# Patient Record
Sex: Male | Born: 1975
Health system: Southern US, Community
[De-identification: ages and names within clinical notes are randomized; demographics above are authoritative.]

## PROBLEM LIST (undated history)

## (undated) HISTORY — PX: OTHER SURGICAL HISTORY: SHX169

---
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
## 2019-05-30 DIAGNOSIS — M1711 Unilateral primary osteoarthritis, right knee: Secondary | ICD-10-CM | POA: Diagnosis not present

## 2019-05-30 DIAGNOSIS — M25561 Pain in right knee: Secondary | ICD-10-CM | POA: Diagnosis not present

## 2019-05-30 DIAGNOSIS — M25562 Pain in left knee: Secondary | ICD-10-CM | POA: Diagnosis not present

## 2019-06-15 DIAGNOSIS — M25562 Pain in left knee: Secondary | ICD-10-CM | POA: Diagnosis not present

## 2019-06-21 DIAGNOSIS — M25562 Pain in left knee: Secondary | ICD-10-CM | POA: Diagnosis not present

## 2019-06-21 DIAGNOSIS — M222X2 Patellofemoral disorders, left knee: Secondary | ICD-10-CM | POA: Diagnosis not present

## 2019-07-06 DIAGNOSIS — M25561 Pain in right knee: Secondary | ICD-10-CM | POA: Diagnosis not present

## 2019-07-06 DIAGNOSIS — M222X1 Patellofemoral disorders, right knee: Secondary | ICD-10-CM | POA: Diagnosis not present

## 2019-07-06 DIAGNOSIS — M222X2 Patellofemoral disorders, left knee: Secondary | ICD-10-CM | POA: Diagnosis not present

## 2019-07-06 DIAGNOSIS — M25562 Pain in left knee: Secondary | ICD-10-CM | POA: Diagnosis not present

## 2019-07-27 DIAGNOSIS — M25561 Pain in right knee: Secondary | ICD-10-CM | POA: Diagnosis not present

## 2019-07-27 DIAGNOSIS — M222X1 Patellofemoral disorders, right knee: Secondary | ICD-10-CM | POA: Diagnosis not present

## 2019-07-27 DIAGNOSIS — M222X2 Patellofemoral disorders, left knee: Secondary | ICD-10-CM | POA: Diagnosis not present

## 2019-07-27 DIAGNOSIS — M25562 Pain in left knee: Secondary | ICD-10-CM | POA: Diagnosis not present

## 2019-10-13 DIAGNOSIS — Z20828 Contact with and (suspected) exposure to other viral communicable diseases: Secondary | ICD-10-CM | POA: Diagnosis not present

## 2019-10-13 DIAGNOSIS — Z03818 Encounter for observation for suspected exposure to other biological agents ruled out: Secondary | ICD-10-CM | POA: Diagnosis not present

## 2020-02-27 ENCOUNTER — Other Ambulatory Visit: Payer: Self-pay

## 2020-02-27 ENCOUNTER — Ambulatory Visit (INDEPENDENT_AMBULATORY_CARE_PROVIDER_SITE_OTHER): Payer: BC Managed Care – PPO | Admitting: Family Medicine

## 2020-02-27 ENCOUNTER — Encounter: Payer: Self-pay | Admitting: Family Medicine

## 2020-02-27 VITALS — BP 118/76 | HR 87 | Temp 97.8°F | Ht 72.75 in | Wt 182.1 lb

## 2020-02-27 DIAGNOSIS — Z0000 Encounter for general adult medical examination without abnormal findings: Secondary | ICD-10-CM

## 2020-02-27 DIAGNOSIS — Z23 Encounter for immunization: Secondary | ICD-10-CM

## 2020-02-27 DIAGNOSIS — D229 Melanocytic nevi, unspecified: Secondary | ICD-10-CM | POA: Diagnosis not present

## 2020-02-27 NOTE — Progress Notes (Signed)
New Patient Office Visit  Subjective:  Patient ID: John Guerra, male    DOB: 09-23-1975  Age: 44 y.o. MRN: [id number]  CC:  Chief Complaint  Patient presents with  . New Patient (Initial Visit)    pt is here to establish care     HPI John Guerra presents for establishing care and for well visit.  Generally very healthy.  He grew up in the [facility] area.  He had previous left inguinal hernia repair around age 87.  Previous arthroscopic knee surgery around age 5.  Very engaged with sports.  He is done several triathlons including couple of [place].  He is starting to develop some increasing knee pains over time  Social history-married with 3 children.  He lived in [place] for about 8 years.  Works for [employer].  Never smoked.  Occasional alcohol use.  Family history-mother and father both alive.  Mom has type 2 diabetes.  Father he thinks had stroke.  He is not sure but thinks his father's had congestive heart failure.  He had a sister that died of drug overdose.  His last tetanus is unknown.  He thinks this has been probably around 12 years ago.  No past medical history on file.    No family history on file.  Social History   Socioeconomic History  . Marital status: Married    Spouse name: Not on file  . Number of children: Not on file  . Years of education: Not on file  . Highest education level: Not on file  Occupational History  . Not on file  Tobacco Use  . Smoking status: Never Smoker  . Smokeless tobacco: Never Used  Substance and Sexual Activity  . Alcohol use: Not on file  . Drug use: Not on file  . Sexual activity: Yes    Partners: Female  Other Topics Concern  . Not on file  Social History Narrative  . Not on file   Social Determinants of Health   Financial Resource Strain:   . Difficulty of Paying Living Expenses:   Food Insecurity:   . Worried About Charity fundraiser in the Last Year:   . Arboriculturist in the Last  Year:   Transportation Needs:   . Film/video editor (Medical):   Marland Kitchen Lack of Transportation (Non-Medical):   Physical Activity:   . Days of Exercise per Week:   . Minutes of Exercise per Session:   Stress:   . Feeling of Stress :   Social Connections:   . Frequency of Communication with Friends and Family:   . Frequency of Social Gatherings with Friends and Family:   . Attends Religious Services:   . Active Member of Clubs or Organizations:   . Attends Archivist Meetings:   Marland Kitchen Marital Status:   Intimate Partner Violence:   . Fear of Current or Ex-Partner:   . Emotionally Abused:   Marland Kitchen Physically Abused:   . Sexually Abused:     ROS Review of Systems  Constitutional: Negative for activity change, appetite change, fatigue and fever.  HENT: Negative for congestion, ear pain and trouble swallowing.   Eyes: Negative for pain and visual disturbance.  Respiratory: Negative for cough, shortness of breath and wheezing.   Cardiovascular: Negative for chest pain and palpitations.  Gastrointestinal: Negative for abdominal distention, abdominal pain, blood in stool, constipation, diarrhea, nausea, rectal pain and vomiting.  Endocrine: Negative for polydipsia and polyuria.  Genitourinary: Negative  for dysuria, hematuria and testicular pain.  Musculoskeletal: Negative for arthralgias and joint swelling.  Skin: Negative for rash.  Neurological: Negative for dizziness, syncope and headaches.  Hematological: Negative for adenopathy.  Psychiatric/Behavioral: Negative for confusion and dysphoric mood.    Objective:   Today's Vitals: BP 118/76 (BP Location: Left Arm, Patient Position: Sitting, Cuff Size: Normal)   Pulse 87   Temp 97.8 F (36.6 C) (Temporal)   Ht 6' 0.75" (1.848 m)   Wt 182 lb 1.6 oz (82.6 kg)   SpO2 99%   BMI 24.19 kg/m   Physical Exam Vitals reviewed.  Constitutional:      Appearance: Normal appearance.  HENT:     Right Ear: Tympanic membrane and ear  canal normal.     Left Ear: Ear canal normal.  Cardiovascular:     Rate and Rhythm: Normal rate and regular rhythm.     Heart sounds: No murmur heard.   Pulmonary:     Effort: Pulmonary effort is normal.     Breath sounds: Normal breath sounds.  Abdominal:     Palpations: Abdomen is soft. There is no mass.     Tenderness: There is no abdominal tenderness.  Musculoskeletal:     Cervical back: Neck supple.     Right lower leg: No edema.     Left lower leg: No edema.  Lymphadenopathy:     Cervical: No cervical adenopathy.  Skin:    Comments: He has multiple nevi scattered on his trunk.  Neurological:     Mental Status: He is alert.     Assessment & Plan:   Problem List Items Addressed This Visit    None    Visit Diagnoses    Physical exam    -  Primary   Relevant Orders   Basic metabolic panel   Lipid panel   CBC with Differential/Platelet   TSH   Hepatic function panel   Need for Tdap vaccination       Relevant Orders   Tdap vaccine greater than or equal to 7yo IM (Completed)    Generally very healthy 44 year old male.  Mostly vegetarian diet.  Stays very engaged in physical activity.  Does have multiple nevi and we will set up dermatology assessment. Screening labs obtained.  Would consider B12 500 to 1000 mcg supplement daily with his mostly vegetarian diet  Tdap booster given  No outpatient encounter medications on file as of 02/27/2020.   No facility-administered encounter medications on file as of 02/27/2020.    Follow-up: No follow-ups on file.   Carolann Littler, MD

## 2020-02-28 LAB — HEPATIC FUNCTION PANEL
AG Ratio: 2 (calc) (ref 1.0–2.5)
ALT: 9 U/L (ref 9–46)
AST: 16 U/L (ref 10–40)
Albumin: 5 g/dL (ref 3.6–5.1)
Alkaline phosphatase (APISO): 46 U/L (ref 36–130)
Bilirubin, Direct: 0.3 mg/dL — ABNORMAL HIGH (ref 0.0–0.2)
Globulin: 2.5 g/dL (calc) (ref 1.9–3.7)
Indirect Bilirubin: 1.2 mg/dL (calc) (ref 0.2–1.2)
Total Bilirubin: 1.5 mg/dL — ABNORMAL HIGH (ref 0.2–1.2)
Total Protein: 7.5 g/dL (ref 6.1–8.1)

## 2020-02-28 LAB — BASIC METABOLIC PANEL
BUN: 14 mg/dL (ref 7–25)
CO2: 30 mmol/L (ref 20–32)
Calcium: 9.9 mg/dL (ref 8.6–10.3)
Chloride: 102 mmol/L (ref 98–110)
Creat: 0.99 mg/dL (ref 0.60–1.35)
Glucose, Bld: 93 mg/dL (ref 65–99)
Potassium: 4.3 mmol/L (ref 3.5–5.3)
Sodium: 138 mmol/L (ref 135–146)

## 2020-02-28 LAB — CBC WITH DIFFERENTIAL/PLATELET
Absolute Monocytes: 509 cells/uL (ref 200–950)
Basophils Absolute: 58 cells/uL (ref 0–200)
Basophils Relative: 1.1 %
Eosinophils Absolute: 69 cells/uL (ref 15–500)
Eosinophils Relative: 1.3 %
HCT: 39.2 % (ref 38.5–50.0)
Hemoglobin: 13.1 g/dL — ABNORMAL LOW (ref 13.2–17.1)
Lymphs Abs: 1590 cells/uL (ref 850–3900)
MCH: 31.3 pg (ref 27.0–33.0)
MCHC: 33.4 g/dL (ref 32.0–36.0)
MCV: 93.8 fL (ref 80.0–100.0)
MPV: 9.8 fL (ref 7.5–12.5)
Monocytes Relative: 9.6 %
Neutro Abs: 3074 cells/uL (ref 1500–7800)
Neutrophils Relative %: 58 %
Platelets: 227 10*3/uL (ref 140–400)
RBC: 4.18 10*6/uL — ABNORMAL LOW (ref 4.20–5.80)
RDW: 12.3 % (ref 11.0–15.0)
Total Lymphocyte: 30 %
WBC: 5.3 10*3/uL (ref 3.8–10.8)

## 2020-02-28 LAB — LIPID PANEL
Cholesterol: 186 mg/dL (ref <200)
HDL: 72 mg/dL (ref >=40)
LDL Cholesterol (Calc): 97 mg/dL (calc)
Non-HDL Cholesterol (Calc): 114 mg/dL (calc) (ref <130)
Total CHOL/HDL Ratio: 2.6 (calc) (ref <5.0)
Triglycerides: 84 mg/dL (ref <150)

## 2020-02-28 LAB — TSH: TSH: 0.93 mIU/L (ref 0.40–4.50)

## 2020-02-29 ENCOUNTER — Telehealth: Payer: Self-pay | Admitting: Family Medicine

## 2020-02-29 NOTE — Telephone Encounter (Signed)
Pt returning yesterday's call.

## 2020-03-01 ENCOUNTER — Other Ambulatory Visit: Payer: Self-pay

## 2020-03-01 DIAGNOSIS — D649 Anemia, unspecified: Secondary | ICD-10-CM

## 2020-03-01 NOTE — Telephone Encounter (Signed)
See lab results.  

## 2020-03-04 NOTE — Addendum Note (Signed)
Addended by: Marrion Coy on: 03/04/2020 03:51 PM   Modules accepted: Orders

## 2020-03-25 ENCOUNTER — Other Ambulatory Visit: Payer: Self-pay

## 2020-03-25 ENCOUNTER — Other Ambulatory Visit (INDEPENDENT_AMBULATORY_CARE_PROVIDER_SITE_OTHER): Payer: BC Managed Care – PPO

## 2020-03-25 DIAGNOSIS — D649 Anemia, unspecified: Secondary | ICD-10-CM

## 2020-03-25 DIAGNOSIS — D510 Vitamin B12 deficiency anemia due to intrinsic factor deficiency: Secondary | ICD-10-CM | POA: Diagnosis not present

## 2020-03-26 ENCOUNTER — Other Ambulatory Visit: Payer: Self-pay

## 2020-03-26 DIAGNOSIS — D649 Anemia, unspecified: Secondary | ICD-10-CM

## 2020-03-26 LAB — CBC WITH DIFFERENTIAL/PLATELET
Absolute Monocytes: 499 cells/uL (ref 200–950)
Basophils Absolute: 58 cells/uL (ref 0–200)
Basophils Relative: 1.2 %
Eosinophils Absolute: 149 cells/uL (ref 15–500)
Eosinophils Relative: 3.1 %
HCT: 36.4 % — ABNORMAL LOW (ref 38.5–50.0)
Hemoglobin: 12.2 g/dL — ABNORMAL LOW (ref 13.2–17.1)
Lymphs Abs: 1584 cells/uL (ref 850–3900)
MCH: 31 pg (ref 27.0–33.0)
MCHC: 33.5 g/dL (ref 32.0–36.0)
MCV: 92.6 fL (ref 80.0–100.0)
MPV: 10 fL (ref 7.5–12.5)
Monocytes Relative: 10.4 %
Neutro Abs: 2510 cells/uL (ref 1500–7800)
Neutrophils Relative %: 52.3 %
Platelets: 198 10*3/uL (ref 140–400)
RBC: 3.93 10*6/uL — ABNORMAL LOW (ref 4.20–5.80)
RDW: 12.5 % (ref 11.0–15.0)
Total Lymphocyte: 33 %
WBC: 4.8 10*3/uL (ref 3.8–10.8)

## 2020-03-26 LAB — IRON,TIBC AND FERRITIN PANEL
%SAT: 34 % (calc) (ref 20–48)
Ferritin: 36 ng/mL — ABNORMAL LOW (ref 38–380)
Iron: 122 ug/dL (ref 50–180)
TIBC: 356 mcg/dL (calc) (ref 250–425)

## 2020-03-26 LAB — VITAMIN B12: Vitamin B-12: 283 pg/mL (ref 200–1100)

## 2020-04-05 ENCOUNTER — Other Ambulatory Visit: Payer: BC Managed Care – PPO

## 2020-04-05 DIAGNOSIS — D649 Anemia, unspecified: Secondary | ICD-10-CM

## 2020-04-05 LAB — FECAL OCCULT BLOOD, IMMUNOCHEMICAL: Fecal Occult Bld: NEGATIVE

## 2020-04-05 NOTE — Addendum Note (Signed)
Addended by: Trenda Moots on: 5/52/5894 10:15 AM   Modules accepted: Orders

## 2020-04-06 DIAGNOSIS — Z03818 Encounter for observation for suspected exposure to other biological agents ruled out: Secondary | ICD-10-CM | POA: Diagnosis not present

## 2020-04-06 DIAGNOSIS — Z20822 Contact with and (suspected) exposure to covid-19: Secondary | ICD-10-CM | POA: Diagnosis not present

## 2020-05-10 DIAGNOSIS — L989 Disorder of the skin and subcutaneous tissue, unspecified: Secondary | ICD-10-CM

## 2020-05-10 DIAGNOSIS — D1801 Hemangioma of skin and subcutaneous tissue: Secondary | ICD-10-CM | POA: Diagnosis not present

## 2020-05-10 DIAGNOSIS — D225 Melanocytic nevi of trunk: Secondary | ICD-10-CM | POA: Diagnosis not present

## 2020-05-10 DIAGNOSIS — L821 Other seborrheic keratosis: Secondary | ICD-10-CM | POA: Diagnosis not present

## 2020-05-10 HISTORY — DX: Disorder of the skin and subcutaneous tissue, unspecified: L98.9

## 2020-11-19 ENCOUNTER — Ambulatory Visit (INDEPENDENT_AMBULATORY_CARE_PROVIDER_SITE_OTHER): Payer: BC Managed Care – PPO | Admitting: Family Medicine

## 2020-11-19 ENCOUNTER — Encounter: Payer: Self-pay | Admitting: Family Medicine

## 2020-11-19 ENCOUNTER — Other Ambulatory Visit: Payer: Self-pay

## 2020-11-19 VITALS — BP 130/80 | HR 63 | Temp 98.0°F | Wt 183.5 lb

## 2020-11-19 DIAGNOSIS — L02214 Cutaneous abscess of groin: Secondary | ICD-10-CM | POA: Diagnosis not present

## 2020-11-19 NOTE — Progress Notes (Signed)
Established Patient Office Visit  Subjective:  Patient ID: John Guerra, male    DOB: 1976-07-29  Age: 45 y.o. MRN: [id number]  CC:  Chief Complaint  Patient presents with  . saddle sore    X 2 weeks, continues to get bigger and more painful    HPI [name] presents for "saddle sore "which started about 2 weeks ago.  He did about a 2-hour bike ride and noticed some soreness after that.  He had some swelling and mild erythema and pain has gotten worse gradually over 2 weeks.  No drainage.  Pain with sitting since then.  No fevers or chills.  No history of abscess in this region.  Generally very healthy and takes no regular medications.  He is currently training for [employer] triathlon but has had to curtail training past couple weeks because of this.  Past Medical History:  Diagnosis Date  . Benign skin lesion 05/10/2020      No family history on file.  Social History   Socioeconomic History  . Marital status: Married    Spouse name: Not on file  . Number of children: Not on file  . Years of education: Not on file  . Highest education level: Not on file  Occupational History  . Not on file  Tobacco Use  . Smoking status: Never Smoker  . Smokeless tobacco: Never Used  Substance and Sexual Activity  . Alcohol use: Not on file  . Drug use: Not on file  . Sexual activity: Yes    Partners: Female  Other Topics Concern  . Not on file  Social History Narrative  . Not on file   Social Determinants of Health   Financial Resource Strain: Not on file  Food Insecurity: Not on file  Transportation Needs: Not on file  Physical Activity: Not on file  Stress: Not on file  Social Connections: Not on file  Intimate Partner Violence: Not on file    No outpatient medications prior to visit.   No facility-administered medications prior to visit.    No Known Allergies  ROS Review of Systems  Constitutional: Negative for chills and fever.       Objective:    Physical Exam Vitals reviewed.  Constitutional:      Appearance: Normal appearance.  Cardiovascular:     Rate and Rhythm: Normal rate and regular rhythm.  Skin:    Comments: Left groin region reveals somewhat elongated area of mild erythema and swelling.  This is inferior to the rectal region and lateral to the scrotal region.  Very tender to palpation and fluctuant  Neurological:     Mental Status: He is alert.     BP 130/80 (BP Location: Left Arm, Patient Position: Sitting, Cuff Size: Normal)   Pulse 63   Temp 98 F (36.7 C) (Oral)   Wt 183 lb 8 oz (83.2 kg)   SpO2 98%   BMI 24.38 kg/m  Wt Readings from Last 3 Encounters:  11/19/20 183 lb 8 oz (83.2 kg)  02/27/20 182 lb 1.6 oz (82.6 kg)     Health Maintenance Due  Topic Date Due  . Hepatitis C Screening  Never done  . HIV Screening  Never done    There are no preventive care reminders to display for this patient.  Lab Results  Component Value Date   TSH 0.93 02/27/2020   Lab Results  Component Value Date   WBC 4.8 03/25/2020   HGB 12.2 (L) 03/25/2020  HCT 36.4 (L) 03/25/2020   MCV 92.6 03/25/2020   PLT 198 03/25/2020   Lab Results  Component Value Date   NA 138 02/27/2020   K 4.3 02/27/2020   CO2 30 02/27/2020   GLUCOSE 93 02/27/2020   BUN 14 02/27/2020   CREATININE 0.99 02/27/2020   BILITOT 1.5 (H) 02/27/2020   AST 16 02/27/2020   ALT 9 02/27/2020   PROT 7.5 02/27/2020   CALCIUM 9.9 02/27/2020   Lab Results  Component Value Date   CHOL 186 02/27/2020   Lab Results  Component Value Date   HDL 72 02/27/2020   Lab Results  Component Value Date   LDLCALC 97 02/27/2020   Lab Results  Component Value Date   TRIG 84 02/27/2020   Lab Results  Component Value Date   CHOLHDL 2.6 02/27/2020   No results found for: HGBA1C    Assessment & Plan:   Left groin abscess.  He has had issues for 2 weeks now.  Area of fluctuance as addressed above.  We discussed the risk and  benefits of incision and drainage including risk of pain and bleeding and patient consented.  Prepped skin with Betadine.  Local anesthesia with 1% plain Xylocaine.  Made approximately 1 cm linear incision over area of fluctuance with #11 blade.  Copious pus was expressed.  Explored deeper pus pockets with curved hemostats.  Minimal bleeding.  Wound cavity was packed with quarter inch iodoform gauze.  Outer dressing applied.  -Keep area dry until follow-up -He will follow-up late tomorrow afternoon for reassessment -After packing removed we will plan warm sitz baths for the next few days  No orders of the defined types were placed in this encounter.   Follow-up: No follow-ups on file.    Carolann Littler, MD

## 2020-11-19 NOTE — Patient Instructions (Signed)
Skin Abscess  A skin abscess is an infected area on or under your skin that contains a collection of pus and other material. An abscess may also be called a furuncle, carbuncle, or boil. An abscess can occur in or on almost any part of your body. Some abscesses break open (rupture) on their own. Most continue to get worse unless they are treated. The infection can spread deeper into the body and eventually into your blood, which can make you feel ill. Treatment usually involves draining the abscess. What are the causes? An abscess occurs when germs, like bacteria, pass through your skin and cause an infection. This may be caused by:  A scrape or cut on your skin.  A puncture wound through your skin, including a needle injection or insect bite.  Blocked oil or sweat glands.  Blocked and infected hair follicles.  A cyst that forms beneath your skin (sebaceous cyst) and becomes infected. What increases the risk? This condition is more likely to develop in people who:  Have a weak body defense system (immune system).  Have diabetes.  Have dry and irritated skin.  Get frequent injections or use illegal IV drugs.  Have a foreign body in a wound, such as a splinter.  Have problems with their lymph system or veins. What are the signs or symptoms? Symptoms of this condition include:  A painful, firm bump under the skin.  A bump with pus at the top. This may break through the skin and drain. Other symptoms include:  Redness surrounding the abscess site.  Warmth.  Swelling of the lymph nodes (glands) near the abscess.  Tenderness.  A sore on the skin. How is this diagnosed? This condition may be diagnosed based on:  A physical exam.  Your medical history.  A sample of pus. This may be used to find out what is causing the infection.  Blood tests.  Imaging tests, such as an ultrasound, CT scan, or MRI. How is this treated? A small abscess that drains on its own may not  need treatment. Treatment for larger abscesses may include:  Moist heat or heat pack applied to the area several times a day.  A procedure to drain the abscess (incision and drainage).  Antibiotic medicines. For a severe abscess, you may first get antibiotics through an IV and then change to antibiotics by mouth. Follow these instructions at home: Medicines  Take over-the-counter and prescription medicines only as told by your health care provider.  If you were prescribed an antibiotic medicine, take it as told by your health care provider. Do not stop taking the antibiotic even if you start to feel better.   Abscess care  If you have an abscess that has not drained, apply heat to the affected area. Use the heat source that your health care provider recommends, such as a moist heat pack or a heating pad. ? Place a towel between your skin and the heat source. ? Leave the heat on for 20-30 minutes. ? Remove the heat if your skin turns bright red. This is especially important if you are unable to feel pain, heat, or cold. You may have a greater risk of getting burned.  Follow instructions from your health care provider about how to take care of your abscess. Make sure you: ? Cover the abscess with a bandage (dressing). ? Change your dressing or gauze as told by your health care provider. ? Wash your hands with soap and water before you change the  dressing or gauze. If soap and water are not available, use hand sanitizer.  Check your abscess every day for signs of a worsening infection. Check for: ? More redness, swelling, or pain. ? More fluid or blood. ? Warmth. ? More pus or a bad smell.   General instructions  To avoid spreading the infection: ? Do not share personal care items, towels, or hot tubs with others. ? Avoid making skin contact with other people.  Keep all follow-up visits as told by your health care provider. This is important. Contact a health care provider if you  have:  More redness, swelling, or pain around your abscess.  More fluid or blood coming from your abscess.  Warm skin around your abscess.  More pus or a bad smell coming from your abscess.  A fever.  Muscle aches.  Chills or a general ill feeling. Get help right away if you:  Have severe pain.  See red streaks on your skin spreading away from the abscess. Summary  A skin abscess is an infected area on or under your skin that contains a collection of pus and other material.  A small abscess that drains on its own may not need treatment.  Treatment for larger abscesses may include having a procedure to drain the abscess and taking an antibiotic. This information is not intended to replace advice given to you by your health care provider. Make sure you discuss any questions you have with your health care provider. Document Revised: 11/24/2018 Document Reviewed: 09/16/2017 Elsevier Patient Education  2021 [employer].

## 2020-11-20 ENCOUNTER — Encounter: Payer: Self-pay | Admitting: Family Medicine

## 2020-11-20 ENCOUNTER — Ambulatory Visit (INDEPENDENT_AMBULATORY_CARE_PROVIDER_SITE_OTHER): Payer: BC Managed Care – PPO | Admitting: Family Medicine

## 2020-11-20 VITALS — HR 63 | Temp 97.9°F | Wt 182.5 lb

## 2020-11-20 DIAGNOSIS — L02214 Cutaneous abscess of groin: Secondary | ICD-10-CM

## 2020-11-20 NOTE — Progress Notes (Signed)
John Guerra was seen for follow-up regarding left groin abscess.  We performed incision and drainage yesterday.  He feels much better today.  Still has some mild soreness.  No fevers or chills.  Never changed outer dressing.  Vital signs are stable.  Afebrile  Wound is examined.  Outer dressing and packing removed.  Less erythema.  No fluctuance.  Much less tender.  Minimal purulent drainage.  Abscess left groin status post incision and drainage.  Improved. -Packing removed -Start warm sitz bath as with Epson salt starting tonight and continue for the next 3 or 4 days -Follow-up promptly for any recurrent swelling, pain, or other concerns  [name] MD [facility]

## 2020-11-20 NOTE — Patient Instructions (Signed)
Start warm water sitz baths at least once daily for the next 3-4 days  Follow up for any recurrent redness, swelling, or pain.

## 2021-06-25 ENCOUNTER — Ambulatory Visit (INDEPENDENT_AMBULATORY_CARE_PROVIDER_SITE_OTHER): Payer: BC Managed Care – PPO | Admitting: Family Medicine

## 2021-06-25 VITALS — BP 110/70 | HR 60 | Temp 98.2°F | Wt 184.2 lb

## 2021-06-25 DIAGNOSIS — M25552 Pain in left hip: Secondary | ICD-10-CM | POA: Diagnosis not present

## 2021-06-25 DIAGNOSIS — M25532 Pain in left wrist: Secondary | ICD-10-CM

## 2021-06-25 NOTE — Progress Notes (Signed)
Established Patient Office Visit  Subjective:  Patient ID: John Guerra, male    DOB: 04-Jul-1976  Age: 45 y.o. MRN: [id number]  CC: No chief complaint on file.   HPI John Guerra presents for couple of musculoskeletal complaints as below.  He is very active.  Has been active with triathlons in the past though not in the past couple years.  Exercises regularly.  Couple months ago was kicking a soccer ball with one of his children.  He noticed with kicking the ball left anterior hip and slightly medial thigh pain.  Still able to run without difficulty.  No difficulties with cycling.  He has some pain with hip flexion and certain positions.  No pain with basic ambulation such as walking and again no difficulties with running.  Has improved slightly but still has occasional sharp pains with hip flexion and abduction  Second issue is left wrist pain for about 4 months duration.  Denies any known injury.  Pain is located at the joint.  Has not noted any warmth, redness, or visible swelling.  Still able to do push-ups usually 50 to 100/day but does have some pain with push-ups.  Past Medical History:  Diagnosis Date   Benign skin lesion 05/10/2020      No family history on file.  Social History   Socioeconomic History   Marital status: Married    Spouse name: Not on file   Number of children: Not on file   Years of education: Not on file   Highest education level: Not on file  Occupational History   Not on file  Tobacco Use   Smoking status: Never   Smokeless tobacco: Never  Substance and Sexual Activity   Alcohol use: Not on file   Drug use: Not on file   Sexual activity: Yes    Partners: Female  Other Topics Concern   Not on file  Social History Narrative   Not on file   Social Determinants of Health   Financial Resource Strain: Not on file  Food Insecurity: Not on file  Transportation Needs: Not on file  Physical Activity: Not on file  Stress: Not on  file  Social Connections: Not on file  Intimate Partner Violence: Not on file    No outpatient medications prior to visit.   No facility-administered medications prior to visit.    No Known Allergies  ROS Review of Systems  Neurological:  Negative for weakness and numbness.     Objective:    Physical Exam Vitals reviewed.  Musculoskeletal:     Comments: Left wrist reveals no visible swelling.  No erythema.  No warmth.  No evidence for left wrist effusion.  Full range of motion.  No bony tenderness.  Good strength with wrist extension and wrist flexion.  No navicular tenderness.  Left hip reveals excellent range of motion.  Mild pain with hip flexion against resistance.  No tenderness over the proximal sartorius tendon.  Mild adductor tenderness  Neurological:     Mental Status: He is alert.    BP 110/70 (BP Location: Left Arm, Patient Position: Sitting, Cuff Size: Normal)   Pulse 60   Temp 98.2 F (36.8 C) (Oral)   Wt 184 lb 3.2 oz (83.6 kg)   SpO2 98%   BMI 24.47 kg/m  Wt Readings from Last 3 Encounters:  06/25/21 184 lb 3.2 oz (83.6 kg)  11/20/20 182 lb 8 oz (82.8 kg)  11/19/20 183 lb 8 oz (83.2 kg)  Health Maintenance Due  Topic Date Due   HIV Screening  Never done   Hepatitis C Screening  Never done   INFLUENZA VACCINE  03/17/2021    There are no preventive care reminders to display for this patient.  Lab Results  Component Value Date   TSH 0.93 02/27/2020   Lab Results  Component Value Date   WBC 4.8 03/25/2020   HGB 12.2 (L) 03/25/2020   HCT 36.4 (L) 03/25/2020   MCV 92.6 03/25/2020   PLT 198 03/25/2020   Lab Results  Component Value Date   NA 138 02/27/2020   K 4.3 02/27/2020   CO2 30 02/27/2020   GLUCOSE 93 02/27/2020   BUN 14 02/27/2020   CREATININE 0.99 02/27/2020   BILITOT 1.5 (H) 02/27/2020   AST 16 02/27/2020   ALT 9 02/27/2020   PROT 7.5 02/27/2020   CALCIUM 9.9 02/27/2020   Lab Results  Component Value Date   CHOL 186  02/27/2020   Lab Results  Component Value Date   HDL 72 02/27/2020   Lab Results  Component Value Date   LDLCALC 97 02/27/2020   Lab Results  Component Value Date   TRIG 84 02/27/2020   Lab Results  Component Value Date   CHOLHDL 2.6 02/27/2020   No results found for: HGBA1C    Assessment & Plan:   #1 left hip pain for about 4 months duration.  This occurred after kicking a soccer ball. ?  Adductor strain.  We discussed possible physical therapy.  We also discussed possible referral to sports medicine and he prefers the latter.  We will set up referral  #2 left wrist pain for several months duration.  No evidence for active inflammation on exam.  No reported injury. We discussed getting x-ray given duration of symptoms but our x-ray was not available today.  Given the fact that we were setting up to see sports medicine already we will go ahead and have them evaluate as well   No orders of the defined types were placed in this encounter.   Follow-up: No follow-ups on file.    Carolann Littler, MD

## 2021-07-08 NOTE — Progress Notes (Signed)
John Guerra D.[place] Phone: [phone]   Assessment and Plan:     1. Left hip pain -Chronic, unchanged, initial visit - Chronic intermittent, sharp right hip pain with hip flexion improved with rest, but has not resolved - Suspect irritation of hip flexors, specifically sartorius based on TTP at [facility].  Differential includes hip flexor strain versus labral pathology versus FAI - Patient elects to start with conservative therapy targeting hip flexors - Offered course of meloxicam, however patient declines at this time.  Could be offered at future visit if no improvement or worsening of symptoms - X-ray obtained in clinic.  My interpretation: Small pincer deformity at left acetabulum, not present on right.  No acute fracture or dislocation.  No significant cortical irregularities. - DG HIP UNILAT WITH PELVIS 2-3 VIEWS LEFT; Future - [place] LIMITED JOINT SPACE STRUCTURES LOW LEFT(NO LINKED CHARGES)  2. Left wrist pain -Chronic, unchanged, initial visit - Unclear etiology of dorsal left wrist pain.  Differential includes tendinopathy of third and fourth dorsal wrist compartments versus dorsal ganglion cyst - We will start with conservative therapy including decreasing activities that involve wrist extension - Offered course of meloxicam, however patient declines at this time.  Could be offered at future visit if no improvement or worsening of symptoms - X-ray obtained in clinic.  My interpretation: No acute fracture or dislocation - Ultrasound performed in clinic with apparent soft tissue swelling deep to third and fourth dorsal wrist compartments. - DG Wrist Complete Left; Future    Sports Medicine: Musculoskeletal Ultrasound. Exam: Limited US of left wrist Diagnosis: Left wrist pain  US Findings: Hypoechoic ring surrounding third dorsal compartment immediately proximal to Lister's tubercle.  No signs of  intersection syndrome.  Mild soft tissue edema deep to third and fourth compartments.  US Impression:  EPL tendinopathy versus 2.  Dorsal ganglion cyst  Pertinent previous records reviewed include none   Follow Up: 3 to 4 weeks for reevaluation of symptoms.  Could consider formal PT versus NSAID course versus CSI versus ultrasound of hip flexors based on symptoms   Subjective:   I, John Guerra, am serving as a scribe for Dr. Glennon Mac  Chief Complaint: Left hip and left wrist pain   HPI:   07/09/21 Patient is a 45 year old male presenting with left hip and left wrist pain. Patient states that he noticed the hip pain a couple months ago while playing soccer with one of his children. Patient noticed when kicking he was having left anterior hip and slight medial thigh pain. Felt a pop in that moment of strike. Patient states that the pain is worse with hip flexion, abduction and certain positions. Patient states the pain is better but will still get occasional sharp pains. 6-8 weeks later was able to run again. Doesn't feel when running, but flutter kick in pool and active hip flexion. Resting that 6 weeks made it better.  Patient also C/O left wrist pain going on for 4 months with no MOI. Patient does notice some pain with push-ups but is still able to do them. Wrist pain on dorsal side with wrist extension. Feels it when bracing on bike.  Left wrist  Radiates: No Mechanical symptoms: No Swelling: no Treatments tried: Turmeric, rest   Relevant Historical Information: None pertinent  Additional pertinent review of systems negative.  No current outpatient medications on file.   Objective:     Vitals:  07/09/21 1103  BP: 118/86  Pulse: 61  SpO2: 96%  Weight: 181 lb (82.1 kg)  Height: 6' (1.829 m)      Body mass index is 24.55 kg/m.    Physical Exam:    General: Appears well, nad, nontoxic and pleasant Neuro:sensation intact, strength is 5/5 with df/pf/inv/ev,  muscle tone wnl Skin:no susupicious lesions or rashes  Left wrist:  No deformity or swelling appreciated. ROM  Ext 90, flexion70, radial/ulnar deviation 30 TTP mildly over dorsal wrist nttp over the snauff box, dorsal carpals, volar carpals, radial styloid, ulnar styloid, 1st mcp, tfcc Negative Tinel's Negative finklestein Neg tfcc bounce test No pain with resisted ext, flex or deviation   General: awake, alert, and oriented no acute distress, nontoxic Skin: no suspicious lesions or rashes Neuro:sensation intact distally with no dificits, normal muscle tone, no atrophy, strength 5/5 in all tested lower ext groups Psych: normal mood and affect, speech clear  Left hip: No deformity, swelling or wasting ROM Fexion 90, ext 30, IR 45, ER 45 TTP hip flexors, ASIS NTTP over the greater troch, glute musculature, si joint, lumbar spine Pain with resisted hip flexion Negative log roll with FROM Negative FABER Negative FADIR Negative Piriformis test Negative trendelenberg Gait normal   Electronically signed by:  John Guerra D.[facility] 11:58 AM 07/09/21

## 2021-07-09 ENCOUNTER — Ambulatory Visit (INDEPENDENT_AMBULATORY_CARE_PROVIDER_SITE_OTHER): Payer: BC Managed Care – PPO

## 2021-07-09 ENCOUNTER — Ambulatory Visit (INDEPENDENT_AMBULATORY_CARE_PROVIDER_SITE_OTHER): Payer: BC Managed Care – PPO | Admitting: Sports Medicine

## 2021-07-09 ENCOUNTER — Ambulatory Visit: Payer: Self-pay

## 2021-07-09 ENCOUNTER — Other Ambulatory Visit: Payer: Self-pay

## 2021-07-09 VITALS — BP 118/86 | HR 61 | Ht 72.0 in | Wt 181.0 lb

## 2021-07-09 DIAGNOSIS — M25532 Pain in left wrist: Secondary | ICD-10-CM

## 2021-07-09 DIAGNOSIS — M25552 Pain in left hip: Secondary | ICD-10-CM

## 2021-07-09 DIAGNOSIS — M7989 Other specified soft tissue disorders: Secondary | ICD-10-CM | POA: Diagnosis not present

## 2021-07-09 NOTE — Patient Instructions (Signed)
Good to see you  Hip flexor exercises given Limit activities that put wrist into extension  Follow up in 3-4 weeks

## 2021-07-14 ENCOUNTER — Ambulatory Visit (INDEPENDENT_AMBULATORY_CARE_PROVIDER_SITE_OTHER): Payer: BC Managed Care – PPO | Admitting: Family Medicine

## 2021-07-14 ENCOUNTER — Encounter: Payer: Self-pay | Admitting: Family Medicine

## 2021-07-14 VITALS — BP 110/64 | HR 65 | Temp 97.9°F | Ht 72.0 in | Wt 185.2 lb

## 2021-07-14 DIAGNOSIS — Z0000 Encounter for general adult medical examination without abnormal findings: Secondary | ICD-10-CM

## 2021-07-14 DIAGNOSIS — Z23 Encounter for immunization: Secondary | ICD-10-CM

## 2021-07-14 DIAGNOSIS — Z862 Personal history of diseases of the blood and blood-forming organs and certain disorders involving the immune mechanism: Secondary | ICD-10-CM | POA: Diagnosis not present

## 2021-07-14 LAB — CBC WITH DIFFERENTIAL/PLATELET
Basophils Absolute: 0.1 10*3/uL (ref 0.0–0.1)
Basophils Relative: 1.3 % (ref 0.0–3.0)
Eosinophils Absolute: 0.1 10*3/uL (ref 0.0–0.7)
Eosinophils Relative: 2.3 % (ref 0.0–5.0)
HCT: 40 % (ref 39.0–52.0)
Hemoglobin: 13.4 g/dL (ref 13.0–17.0)
Lymphocytes Relative: 31.2 % (ref 12.0–46.0)
Lymphs Abs: 1.6 10*3/uL (ref 0.7–4.0)
MCHC: 33.5 g/dL (ref 30.0–36.0)
MCV: 93.7 fl (ref 78.0–100.0)
Monocytes Absolute: 0.5 10*3/uL (ref 0.1–1.0)
Monocytes Relative: 10.4 % (ref 3.0–12.0)
Neutro Abs: 2.7 10*3/uL (ref 1.4–7.7)
Neutrophils Relative %: 54.8 % (ref 43.0–77.0)
Platelets: 227 10*3/uL (ref 150.0–400.0)
RBC: 4.27 Mil/uL (ref 4.22–5.81)
RDW: 13.3 % (ref 11.5–15.5)
WBC: 5 10*3/uL (ref 4.0–10.5)

## 2021-07-14 LAB — BASIC METABOLIC PANEL
BUN: 14 mg/dL (ref 6–23)
CO2: 29 mEq/L (ref 19–32)
Calcium: 10 mg/dL (ref 8.4–10.5)
Chloride: 102 mEq/L (ref 96–112)
Creatinine, Ser: 1.01 mg/dL (ref 0.40–1.50)
GFR: 91.02 mL/min (ref >60.00)
Glucose, Bld: 89 mg/dL (ref 70–99)
Potassium: 4.4 mEq/L (ref 3.5–5.1)
Sodium: 139 mEq/L (ref 135–145)

## 2021-07-14 LAB — TSH: TSH: 0.64 u[IU]/mL (ref 0.35–5.50)

## 2021-07-14 LAB — HEPATIC FUNCTION PANEL
ALT: 14 U/L (ref 0–53)
AST: 30 U/L (ref 0–37)
Albumin: 5 g/dL (ref 3.5–5.2)
Alkaline Phosphatase: 49 U/L (ref 39–117)
Bilirubin, Direct: 0.2 mg/dL (ref 0.0–0.3)
Total Bilirubin: 1.2 mg/dL (ref 0.2–1.2)
Total Protein: 7.8 g/dL (ref 6.0–8.3)

## 2021-07-14 LAB — LIPID PANEL
Cholesterol: 187 mg/dL (ref 0–200)
HDL: 79.5 mg/dL (ref >39.00)
LDL Cholesterol: 93 mg/dL (ref 0–99)
NonHDL: 107.22
Total CHOL/HDL Ratio: 2
Triglycerides: 72 mg/dL (ref 0.0–149.0)
VLDL: 14.4 mg/dL (ref 0.0–40.0)

## 2021-07-14 LAB — VITAMIN B12: Vitamin B-12: 186 pg/mL — ABNORMAL LOW (ref 211–911)

## 2021-07-14 LAB — FERRITIN: Ferritin: 57.2 ng/mL (ref 22.0–322.0)

## 2021-07-14 NOTE — Progress Notes (Signed)
Established Patient Office Visit  Subjective:  Patient ID: John Guerra, male    DOB: 24-Mar-1976  Age: 45 y.o. MRN: [id number]  CC:  Chief Complaint  Patient presents with   Annual Exam    HPI John Guerra presents for physical exam.  Generally very healthy.  No significant chronic medical problems.  He exercises regularly.  Very healthy diet.  Is not a strict vegetarian.  Does do some dairy and eggs.  Takes no medications.  Did have slightly low ferritin level last year with negative Hemoccult.  Family history reviewed.  Mother has type 2 diabetes but is overweight.  He states his father has history of congestive heart failure.  He is not sure regarding etiology of this.  Father also had a stroke in his early 67s.  He had a sister that died of drug overdose.  Social history-he is married.  He has 3 children.  Grew up in [place] area.  Lived in [place] for 8 years.  Works for [employer].  No history of smoking.  Only occasional alcohol use.  Tetanus due 2031.  Influenza vaccine given today.  Declines hepatitis C testing but is low risk.  Past Medical History:  Diagnosis Date   Benign skin lesion 05/10/2020      Family History  Problem Relation Age of Onset   Diabetes Mother    Stroke Father    Hearing loss Father        CHF   Alzheimer's disease Maternal Grandmother    Heart disease Paternal Grandfather 73       MI    Social History   Socioeconomic History   Marital status: Married    Spouse name: Not on file   Number of children: Not on file   Years of education: Not on file   Highest education level: Not on file  Occupational History   Not on file  Tobacco Use   Smoking status: Never   Smokeless tobacco: Never  Substance and Sexual Activity   Alcohol use: Not on file   Drug use: Not on file   Sexual activity: Yes    Partners: Female  Other Topics Concern   Not on file  Social History Narrative   Not on file   Social  Determinants of Health   Financial Resource Strain: Not on file  Food Insecurity: Not on file  Transportation Needs: Not on file  Physical Activity: Not on file  Stress: Not on file  Social Connections: Not on file  Intimate Partner Violence: Not on file    No outpatient medications prior to visit.   No facility-administered medications prior to visit.    No Known Allergies  ROS Review of Systems  Constitutional:  Negative for activity change, appetite change, fatigue and fever.  HENT:  Negative for congestion, ear pain and trouble swallowing.   Eyes:  Negative for pain and visual disturbance.  Respiratory:  Negative for cough, shortness of breath and wheezing.   Cardiovascular:  Negative for chest pain and palpitations.  Gastrointestinal:  Negative for abdominal distention, abdominal pain, blood in stool, constipation, diarrhea, nausea, rectal pain and vomiting.  Genitourinary:  Negative for dysuria, hematuria and testicular pain.  Musculoskeletal:  Negative for arthralgias and joint swelling.  Skin:  Negative for rash.  Neurological:  Negative for dizziness, syncope and headaches.  Hematological:  Negative for adenopathy.  Psychiatric/Behavioral:  Negative for confusion and dysphoric mood.      Objective:  Physical Exam Constitutional:      General: He is not in acute distress.    Appearance: He is well-developed.  HENT:     Head: Normocephalic and atraumatic.     Right Ear: External ear normal.     Left Ear: External ear normal.  Eyes:     Conjunctiva/sclera: Conjunctivae normal.     Pupils: Pupils are equal, round, and reactive to light.  Neck:     Thyroid: No thyromegaly.  Cardiovascular:     Rate and Rhythm: Normal rate and regular rhythm.     Heart sounds: Normal heart sounds. No murmur heard. Pulmonary:     Effort: No respiratory distress.     Breath sounds: No wheezing or rales.  Abdominal:     General: Bowel sounds are normal. There is no  distension.     Palpations: Abdomen is soft. There is no mass.     Tenderness: There is no abdominal tenderness. There is no guarding or rebound.  Musculoskeletal:     Cervical back: Normal range of motion and neck supple.  Lymphadenopathy:     Cervical: No cervical adenopathy.  Skin:    Findings: No rash.  Neurological:     Mental Status: He is alert and oriented to person, place, and time.     Cranial Nerves: No cranial nerve deficit.    BP 110/64 (BP Location: Left Arm, Patient Position: Sitting, Cuff Size: Normal)   Pulse 65   Temp 97.9 F (36.6 C) (Oral)   Ht 6' (1.829 m)   Wt 185 lb 3.2 oz (84 kg)   SpO2 98%   BMI 25.12 kg/m  Wt Readings from Last 3 Encounters:  07/14/21 185 lb 3.2 oz (84 kg)  07/09/21 181 lb (82.1 kg)  06/25/21 184 lb 3.2 oz (83.6 kg)     Health Maintenance Due  Topic Date Due   HIV Screening  Never done    There are no preventive care reminders to display for this patient.  Lab Results  Component Value Date   TSH 0.93 02/27/2020   Lab Results  Component Value Date   WBC 4.8 03/25/2020   HGB 12.2 (L) 03/25/2020   HCT 36.4 (L) 03/25/2020   MCV 92.6 03/25/2020   PLT 198 03/25/2020   Lab Results  Component Value Date   NA 138 02/27/2020   K 4.3 02/27/2020   CO2 30 02/27/2020   GLUCOSE 93 02/27/2020   BUN 14 02/27/2020   CREATININE 0.99 02/27/2020   BILITOT 1.5 (H) 02/27/2020   AST 16 02/27/2020   ALT 9 02/27/2020   PROT 7.5 02/27/2020   CALCIUM 9.9 02/27/2020   Lab Results  Component Value Date   CHOL 186 02/27/2020   Lab Results  Component Value Date   HDL 72 02/27/2020   Lab Results  Component Value Date   LDLCALC 97 02/27/2020   Lab Results  Component Value Date   TRIG 84 02/27/2020   Lab Results  Component Value Date   CHOLHDL 2.6 02/27/2020   No results found for: HGBA1C    Assessment & Plan:   Problem List Items Addressed This Visit   None Visit Diagnoses     Physical exam    -  Primary   Relevant  Orders   Basic metabolic panel   Lipid panel   CBC with Differential/Platelet   TSH   Hepatic function panel   Vitamin B12   Ferritin   Iron and TIBC   Need for  immunization against influenza       Relevant Orders   Flu Vaccine QUAD 59mo+IM (Fluarix, Fluzone & Alfiuria Quad PF) (Completed)     Healthy 45 year old male.  Flu vaccine given.  Obtain screening labs as above.  Patient request follow-up vitamin B-12 level.  Recheck ferritin with slightly low ferritin last year of 36.  We discussed hepatitis C screening but he declines and is low risk.  No orders of the defined types were placed in this encounter.   Follow-up: No follow-ups on file.    Carolann Littler, MD

## 2021-07-15 LAB — IRON AND TIBC
Iron Saturation: 37 % (ref 15–55)
Iron: 126 ug/dL (ref 38–169)
Total Iron Binding Capacity: 344 ug/dL (ref 250–450)
UIBC: 218 ug/dL (ref 111–343)

## 2021-08-06 NOTE — Progress Notes (Signed)
John Guerra D.[place] Phone: [phone]   Assessment and Plan:     1. Left hip pain -Chronic, improved - Improved left hip pain with increasing stretching and relative rest - Continue conservative management - Patient can contact clinic if he feels that improvement plateaus or condition worsens and we could refer to physical therapy we will prescribe course of meloxicam  2. Left wrist pain -Chronic, improved - Improved left wrist pain with activity modification and relative rest - Continue conservative management - Patient can contact clinic if he feels that improvement plateaus or condition worsens and we could refer to physical therapy we will prescribe course of meloxicam    Pertinent previous records reviewed include none   Follow Up: As needed if no improvement or worsening of symptoms   Subjective:    I, John Guerra, am serving as a Education administrator for Doctor Glennon Mac  Chief Complaint: left wrist and hip   HPI:   07/09/21 Patient is a 45 year old male presenting with left hip and left wrist pain. Patient states that he noticed the hip pain a couple months ago while playing soccer with one of his children. Patient noticed when kicking he was having left anterior hip and slight medial thigh pain. Felt a pop in that moment of strike. Patient states that the pain is worse with hip flexion, abduction and certain positions. Patient states the pain is better but will still get occasional sharp pains. 6-8 weeks later was able to run again. Doesn't feel when running, but flutter kick in pool and active hip flexion. Resting that 6 weeks made it better.   Patient also C/O left wrist pain going on for 4 months with no MOI. Patient does notice some pain with push-ups but is still able to do them. Wrist pain on dorsal side with wrist extension. Feels it when bracing on bike.   Left wrist  Radiates:  No Mechanical symptoms: No Swelling: no Treatments tried: Turmeric, rest  08/07/2021 Patient states that he is alright hasn't noticed any inflammation, hip is getting better no restriction and pain getting better slowly.    Relevant Historical Information: None pertinent  Additional pertinent review of systems negative.  No current outpatient medications on file.   Objective:     Vitals:   08/07/21 0754  BP: 100/70  Pulse: 79  SpO2: 97%  Weight: 187 lb (84.8 kg)  Height: 6' (1.829 m)      Body mass index is 25.36 kg/m.    Physical Exam:    General: Appears well, nad, nontoxic and pleasant Neuro:sensation intact, strength is 5/5 with df/pf/inv/ev, muscle tone wnl Skin:no susupicious lesions or rashes   Left wrist:  No deformity or swelling appreciated. ROM  Ext 90, flexion70, radial/ulnar deviation 30 nTTP mildly over dorsal wrist nttp over the snauff box, dorsal carpals, volar carpals, radial styloid, ulnar styloid, 1st mcp, tfcc Negative Tinel's Negative finklestein Neg tfcc bounce test No pain with resisted ext, flex or deviation     Left hip: No deformity, swelling or wasting ROM Fexion 90, ext 30, IR 45, ER 45 nTTP hip flexors, ASIS NTTP over the greater troch, glute musculature, si joint, lumbar spine Mild restriction with  hip flexion compared to right Negative log roll with FROM Negative FABER Negative FADIR Negative Piriformis test Negative trendelenberg Gait normal    Electronically signed by:  John Guerra D.[facility] 8:10 AM  08/07/21 °

## 2021-08-07 ENCOUNTER — Other Ambulatory Visit: Payer: Self-pay

## 2021-08-07 ENCOUNTER — Ambulatory Visit (INDEPENDENT_AMBULATORY_CARE_PROVIDER_SITE_OTHER): Payer: BC Managed Care – PPO | Admitting: Sports Medicine

## 2021-08-07 VITALS — BP 100/70 | HR 79 | Ht 72.0 in | Wt 187.0 lb

## 2021-08-07 DIAGNOSIS — M25552 Pain in left hip: Secondary | ICD-10-CM

## 2021-08-07 DIAGNOSIS — M25532 Pain in left wrist: Secondary | ICD-10-CM

## 2021-08-07 NOTE — Patient Instructions (Addendum)
Good to see you  A

## 2022-01-15 DIAGNOSIS — D2271 Melanocytic nevi of right lower limb, including hip: Secondary | ICD-10-CM | POA: Diagnosis not present

## 2022-01-15 DIAGNOSIS — D2261 Melanocytic nevi of right upper limb, including shoulder: Secondary | ICD-10-CM | POA: Diagnosis not present

## 2022-01-15 DIAGNOSIS — D2262 Melanocytic nevi of left upper limb, including shoulder: Secondary | ICD-10-CM | POA: Diagnosis not present

## 2022-01-15 DIAGNOSIS — D485 Neoplasm of uncertain behavior of skin: Secondary | ICD-10-CM | POA: Diagnosis not present

## 2022-01-15 DIAGNOSIS — D225 Melanocytic nevi of trunk: Secondary | ICD-10-CM | POA: Diagnosis not present

## 2022-01-15 DIAGNOSIS — L573 Poikiloderma of Civatte: Secondary | ICD-10-CM | POA: Diagnosis not present

## 2022-03-01 ENCOUNTER — Other Ambulatory Visit: Payer: Self-pay

## 2022-03-01 ENCOUNTER — Emergency Department (HOSPITAL_COMMUNITY)
Admission: EM | Admit: 2022-03-01 | Discharge: 2022-03-01 | Disposition: A | Discharge status: Home / Self Care | Payer: BC Managed Care – PPO | Attending: Emergency Medicine | Admitting: Emergency Medicine

## 2022-03-01 ENCOUNTER — Emergency Department (HOSPITAL_BASED_OUTPATIENT_CLINIC_OR_DEPARTMENT_OTHER): Payer: BC Managed Care – PPO

## 2022-03-01 ENCOUNTER — Encounter (HOSPITAL_COMMUNITY): Payer: Self-pay | Admitting: Emergency Medicine

## 2022-03-01 DIAGNOSIS — M79601 Pain in right arm: Secondary | ICD-10-CM | POA: Diagnosis not present

## 2022-03-01 DIAGNOSIS — L03113 Cellulitis of right upper limb: Secondary | ICD-10-CM | POA: Diagnosis not present

## 2022-03-01 DIAGNOSIS — R52 Pain, unspecified: Secondary | ICD-10-CM

## 2022-03-01 LAB — CBC WITH DIFFERENTIAL/PLATELET
Abs Immature Granulocytes: 0.05 10*3/uL (ref 0.00–0.07)
Basophils Absolute: 0.1 10*3/uL (ref 0.0–0.1)
Basophils Relative: 1 %
Eosinophils Absolute: 0.1 10*3/uL (ref 0.0–0.5)
Eosinophils Relative: 1 %
HCT: 37.8 % — ABNORMAL LOW (ref 39.0–52.0)
Hemoglobin: 12.6 g/dL — ABNORMAL LOW (ref 13.0–17.0)
Immature Granulocytes: 1 %
Lymphocytes Relative: 18 %
Lymphs Abs: 1.7 10*3/uL (ref 0.7–4.0)
MCH: 30.8 pg (ref 26.0–34.0)
MCHC: 33.3 g/dL (ref 30.0–36.0)
MCV: 92.4 fL (ref 80.0–100.0)
Monocytes Absolute: 0.8 10*3/uL (ref 0.1–1.0)
Monocytes Relative: 9 %
Neutro Abs: 7 10*3/uL (ref 1.7–7.7)
Neutrophils Relative %: 70 %
Platelets: 225 10*3/uL (ref 150–400)
RBC: 4.09 MIL/uL — ABNORMAL LOW (ref 4.22–5.81)
RDW: 12.3 % (ref 11.5–15.5)
WBC: 9.8 10*3/uL (ref 4.0–10.5)
nRBC: 0 % (ref 0.0–0.2)

## 2022-03-01 LAB — COMPREHENSIVE METABOLIC PANEL
ALT: 15 U/L (ref 0–44)
AST: 18 U/L (ref 15–41)
Albumin: 4.4 g/dL (ref 3.5–5.0)
Alkaline Phosphatase: 51 U/L (ref 38–126)
Anion gap: 12 (ref 5–15)
BUN: 24 mg/dL — ABNORMAL HIGH (ref 6–20)
CO2: 26 mmol/L (ref 22–32)
Calcium: 9.8 mg/dL (ref 8.9–10.3)
Chloride: 102 mmol/L (ref 98–111)
Creatinine, Ser: 0.97 mg/dL (ref 0.61–1.24)
GFR, Estimated: >60 mL/min (ref >60)
Glucose, Bld: 115 mg/dL — ABNORMAL HIGH (ref 70–99)
Potassium: 4.1 mmol/L (ref 3.5–5.1)
Sodium: 140 mmol/L (ref 135–145)
Total Bilirubin: 1.1 mg/dL (ref 0.3–1.2)
Total Protein: 7.1 g/dL (ref 6.5–8.1)

## 2022-03-01 MED ORDER — SULFAMETHOXAZOLE-TRIMETHOPRIM 800-160 MG PO TABS: 1.0000 | ORAL_TABLET | Freq: Two times a day (BID) | ORAL | 0 refills | Status: DC

## 2022-03-01 MED ORDER — SULFAMETHOXAZOLE-TRIMETHOPRIM 800-160 MG PO TABS: 1.0000 | ORAL_TABLET | Freq: Two times a day (BID) | ORAL | 0 refills | Status: AC

## 2022-03-01 NOTE — ED Notes (Signed)
Pt provided discharge instructions and prescription information. Pt was given the opportunity to ask questions and questions were answered.   

## 2022-03-01 NOTE — Progress Notes (Signed)
RUE venous duplex has been completed. Preliminary results given to Wyn Quaker, PA-C.   Results can be found under chart review under CV PROC. 03/01/2022 3:53 PM [place] [facility] RVT, RDMS

## 2022-03-01 NOTE — Discharge Instructions (Addendum)
Return for any problem -especially increased pain, increased redness, fever, etc.  Please take all of antibiotic as prescribed.
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
## 2022-03-01 NOTE — ED Provider Notes (Signed)
[facility] EMERGENCY DEPARTMENT Provider Note   CSN: [id number] Arrival date & time: 03/01/22  1408     History  Chief Complaint  Patient presents with   Arm Pain    John Guerra is a 46 y.o. male.  46 year old male with prior medical history as detailed below presents for evaluation.  Patient reports vague pain in the right axilla that started approximately 5 days ago. This morning he noticed a slightly erythematous streak from his right mid bicep extending across his right forearm.  He has a healing wound on the dorsal aspect of his right hand.  This was from a small abrasion that occurred when he was white water rafting on July 3.  The wound is well-healed.  He did report that there was some bleeding from the wound -a displaced scab -approximately 4 to 5 days ago.  He denies any other specific injury. He denies fever.  He denies other rash or complaint.   The history is provided by the patient and medical records.  Arm Pain This is a new problem. The current episode started more than 2 days ago. The problem occurs rarely. The problem has not changed since onset.Pertinent negatives include no chest pain. Nothing aggravates the symptoms. Nothing relieves the symptoms.       Home Medications Prior to Admission medications   Medication Sig Start Date End Date Taking? Authorizing Provider  sulfamethoxazole-trimethoprim (BACTRIM DS) 800-160 MG tablet Take 1 tablet by mouth 2 (two) times daily for 7 days. 03/01/22 03/08/22 Yes Valarie Merino, MD      Allergies    Patient has no known allergies.    Review of Systems   Review of Systems  Cardiovascular:  Negative for chest pain.  All other systems reviewed and are negative.   Physical Exam Updated Vital Signs BP 122/82   Pulse 62   Temp 98.5 F (36.9 C) (Oral)   Resp 14   SpO2 100%  Physical Exam Vitals and nursing note reviewed.  Constitutional:      General: He is not in acute  distress.    Appearance: Normal appearance. He is well-developed.  HENT:     Head: Normocephalic and atraumatic.  Eyes:     Conjunctiva/sclera: Conjunctivae normal.     Pupils: Pupils are equal, round, and reactive to light.  Pulmonary:     Effort: Pulmonary effort is normal. No respiratory distress.  Abdominal:     General: There is no distension.     Palpations: Abdomen is soft.     Tenderness: There is no abdominal tenderness.  Musculoskeletal:        General: No deformity. Normal range of motion.     Cervical back: Normal range of motion and neck supple.     Comments: Minimal erythema and slight ecchymosis in a linear pattern from the patient's medial right bicep extending down into the right forearm.  Minimal to no tenderness overlying this area.  No clear evidence of cellulitis.    Patient with healing wound along the dorsum of the right hand.  No area of inflammation or erythema around this wound.  The patient's right axilla is without palpable lymphadenopathy.  There is no tenderness elicited with deep palpation of the right axilla.  Skin:    General: Skin is warm and dry.  Neurological:     General: No focal deficit present.     Mental Status: He is alert and oriented to person, place, and time.  ED Results / Procedures / Treatments   Labs (all labs ordered are listed, but only abnormal results are displayed) Labs Reviewed  CBC WITH DIFFERENTIAL/PLATELET - Abnormal; Notable for the following components:      Result Value   RBC 4.09 (*)    Hemoglobin 12.6 (*)    HCT 37.8 (*)    All other components within normal limits  COMPREHENSIVE METABOLIC PANEL - Abnormal; Notable for the following components:   Glucose, Bld 115 (*)    BUN 24 (*)    All other components within normal limits    EKG None  Radiology UE VENOUS DUPLEX (7am - 7pm)  Result Date: 03/01/2022 UPPER VENOUS STUDY  Patient Name:  [place]  Date of Exam:   03/01/2022 Medical Rec #:  [id number]             Accession #:    [id number] Date of Birth: 11/24/1975             Patient Gender: M Patient Age:   52 years Exam Location:  [facility] Procedure:      VAS [place] UPPER EXTREMITY VENOUS DUPLEX Referring Phys: Benjamine Mola HAMMOND --------------------------------------------------------------------------------  Indications: Pain, and redness Comparison Study: No previous exams Performing Technologist: Jody Hill RVT, RDMS  Examination Guidelines: A complete evaluation includes B-mode imaging, spectral Doppler, color Doppler, and power Doppler as needed of all accessible portions of each vessel. Bilateral testing is considered an integral part of a complete examination. Limited examinations for reoccurring indications may be performed as noted.  Right Findings: +----------+------------+---------+-----------+----------+-------+ RIGHT     CompressiblePhasicitySpontaneousPropertiesSummary +----------+------------+---------+-----------+----------+-------+ IJV           Full       Yes       Yes                      +----------+------------+---------+-----------+----------+-------+ Subclavian    Full       Yes       Yes                      +----------+------------+---------+-----------+----------+-------+ Axillary      Full       Yes       Yes                      +----------+------------+---------+-----------+----------+-------+ Brachial      Full       Yes       Yes                      +----------+------------+---------+-----------+----------+-------+ Radial        Full                                          +----------+------------+---------+-----------+----------+-------+ Ulnar         Full                                          +----------+------------+---------+-----------+----------+-------+ Cephalic      Full                                          +----------+------------+---------+-----------+----------+-------+  Basilic        Full       Yes       Yes                      +----------+------------+---------+-----------+----------+-------+  Left Findings: +----------+------------+---------+-----------+----------+-------+ LEFT      CompressiblePhasicitySpontaneousPropertiesSummary +----------+------------+---------+-----------+----------+-------+ Subclavian    Full       Yes       Yes                      +----------+------------+---------+-----------+----------+-------+  Summary:  Right: No evidence of deep vein thrombosis in the upper extremity. No evidence of superficial vein thrombosis in the upper extremity.  Left: No evidence of thrombosis in the subclavian.  *See table(s) above for measurements and observations.  Diagnosing physician: Harold Barban MD Electronically signed by Harold Barban MD on 03/01/2022 at 52:17:56 PM.    Final     Procedures Procedures    Medications Ordered in ED Medications - No data to display  ED Course/ Medical Decision Making/ A&P                           Medical Decision Making Risk Prescription drug management.    Medical Screen Complete  This patient presented to the ED with complaint of right arm pain and redness.  This complaint involves an extensive number of treatment options. The initial differential diagnosis includes, but is not limited to, cellulitis versus hematoma versus DVT etc.  This presentation is: Acute, Self-Limited, Previously Undiagnosed, and Uncertain Prognosis  Patient is presenting with complaint of linear minimal erythema and ecchymosis along the right arm.  He cannot report a specific injury.  He denies recent IV.  He does have healing wound to the right hand.  This is without evidence of infection.  DVT ultrasound is negative.  Screening labs obtained are without significant abnormality.  Patient's exam is perhaps most consistent with ecchymosis.  However, out of an abundance of caution patient will be started on a course of  antibiotics to treat possible early cellulitis and/or phlebitis.  Importance of close follow-up is stressed.  Strict return precautions given and understood    Additional history obtained:  Additional history obtained from Spouse External records from outside sources obtained and reviewed including prior ED visits and prior Inpatient records.    Lab Tests:  I ordered and personally interpreted labs.  The pertinent results include: CBC, BMP   Imaging Studies ordered:  I ordered imaging studies including venous duplex of the right arm I independently visualized and interpreted obtained imaging which showed no acute pathology I agree with the radiologist interpretation.   Problem List / ED Course:  Right arm pain, concern for early cellulitis   Reevaluation:  After the interventions noted above, I reevaluated the patient and found that they have: improved   Disposition:  After consideration of the diagnostic results and the patients response to treatment, I feel that the patent would benefit from close outpatient follow-up.          Final Clinical Impression(s) / ED Diagnoses Final diagnoses:  Right arm pain    Rx / DC Orders ED Discharge Orders          Ordered    sulfamethoxazole-trimethoprim (BACTRIM DS) 800-160 MG tablet  2 times daily        03/01/22 1941  Valarie Merino, MD 03/01/22 2105

## 2022-04-30 IMAGING — DX DG HIP (WITH OR WITHOUT PELVIS) 2-3V*L*
3 series · 3 of 3 positions shown · non-contrast
Comparison: None.

CLINICAL DATA: Left hip pain.

EXAM:
DG HIP (WITH OR WITHOUT PELVIS) 2-3V LEFT

[pelvis ap]
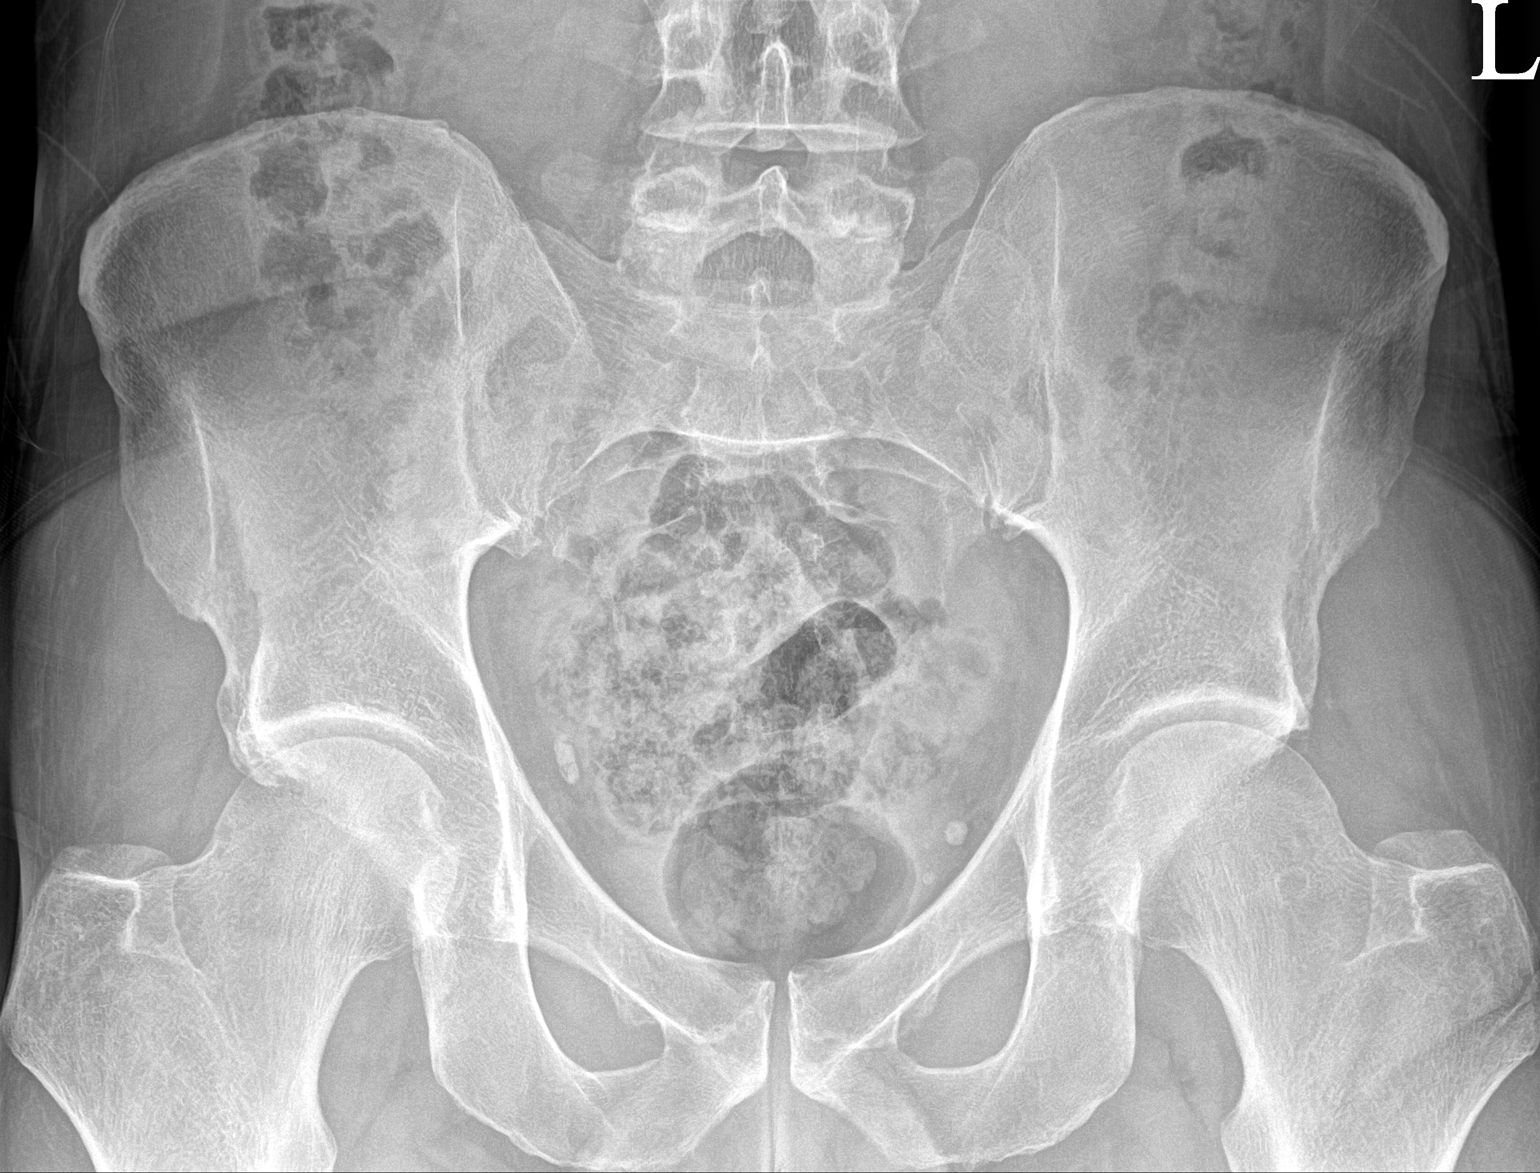

[hip ap]
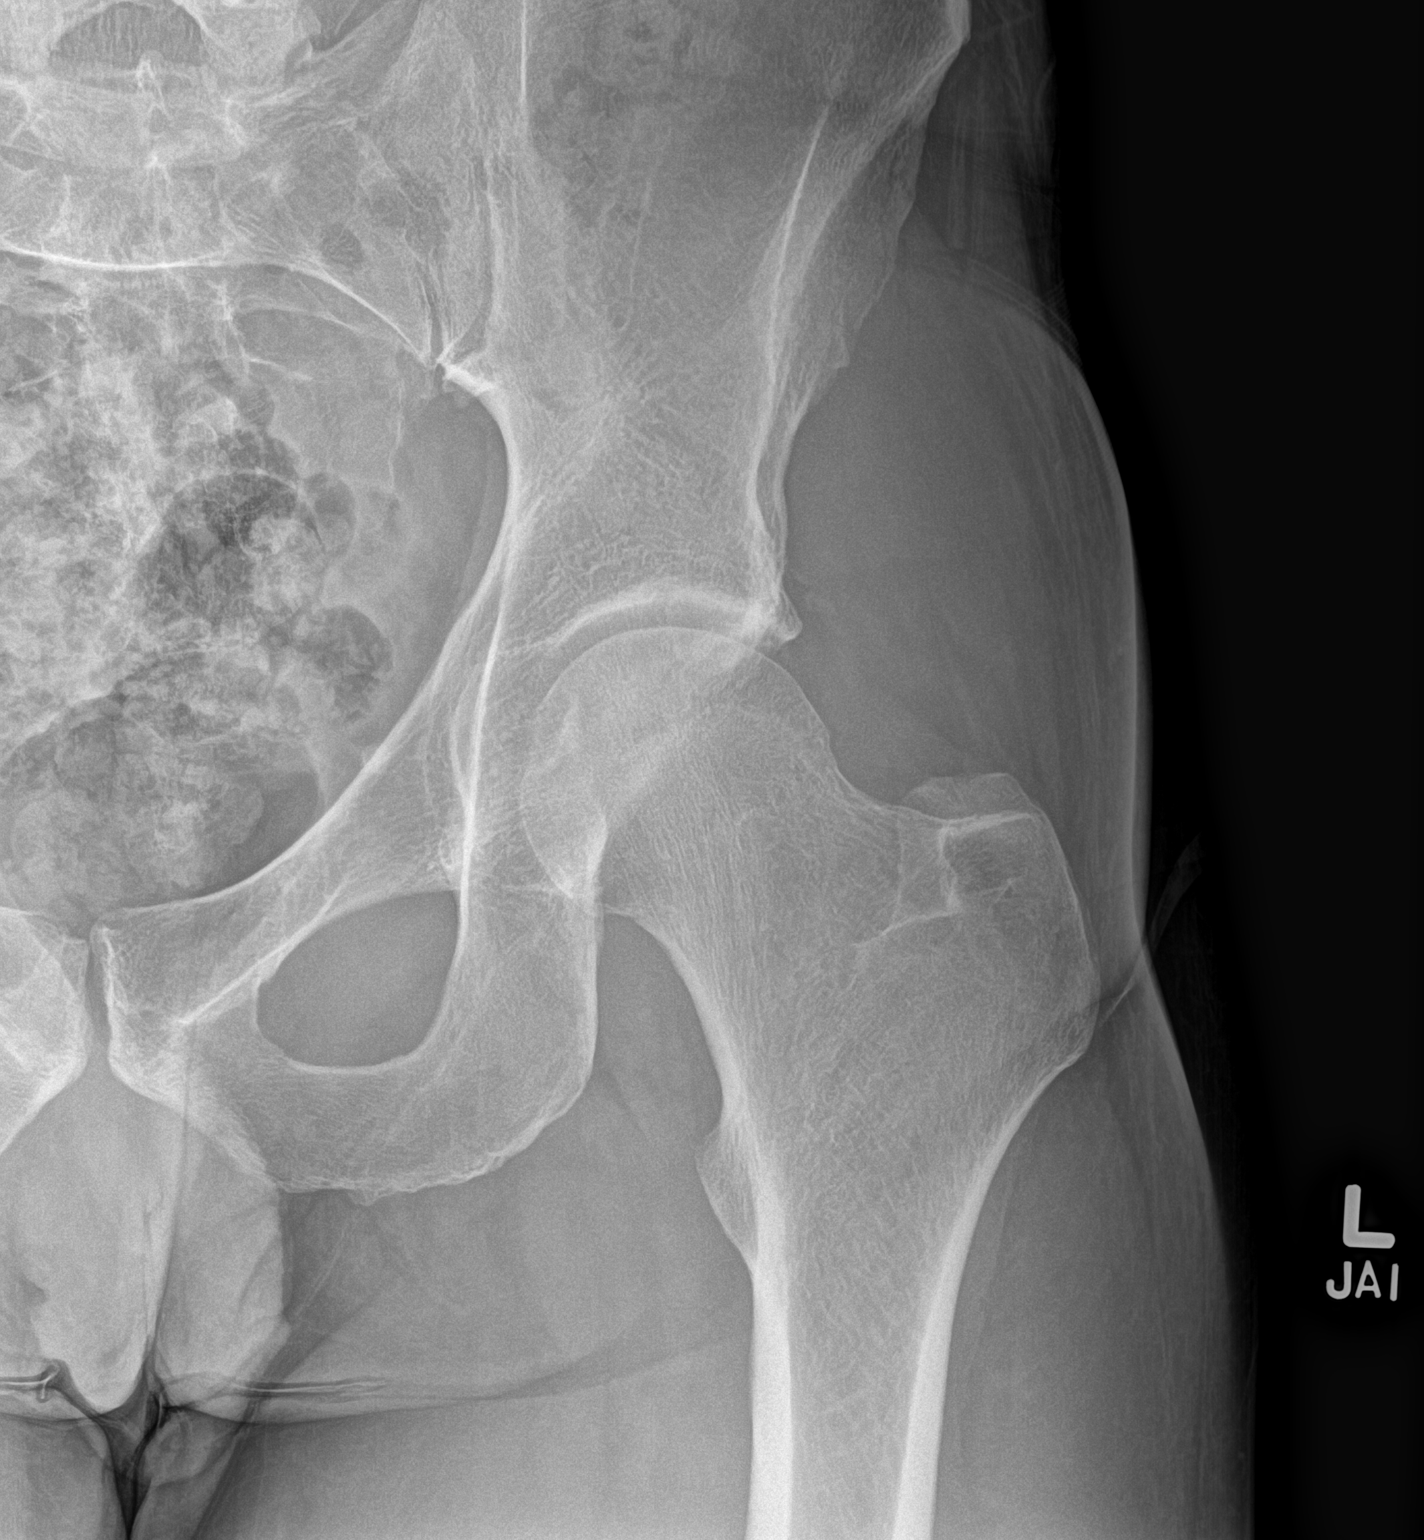

[hip frog leg]
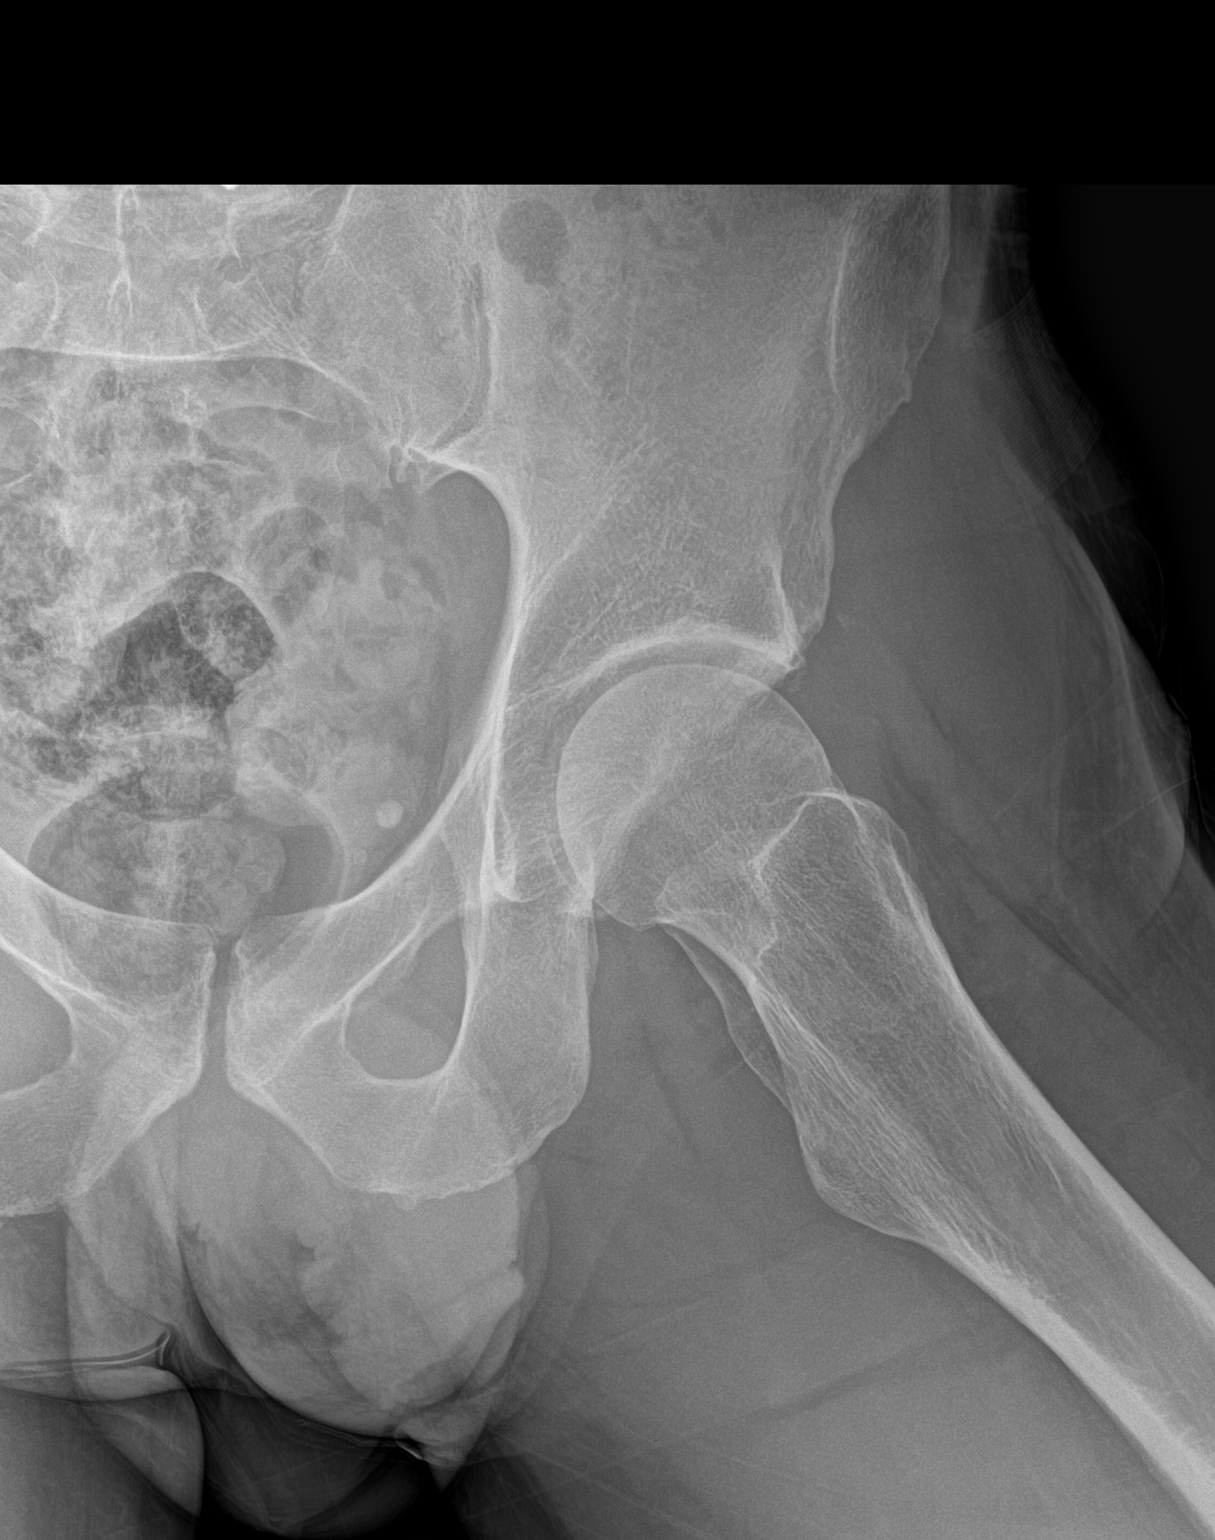

[3 of 3 positions shown; findings below may reference images not displayed]

FINDINGS: There is no evidence of hip fracture or dislocation. There is no
evidence of arthropathy or other focal bone abnormality.
IMPRESSION: Negative.

## 2022-09-22 DIAGNOSIS — L239 Allergic contact dermatitis, unspecified cause: Secondary | ICD-10-CM | POA: Diagnosis not present

## 2022-10-23 DIAGNOSIS — L239 Allergic contact dermatitis, unspecified cause: Secondary | ICD-10-CM | POA: Diagnosis not present

## 2023-02-19 ENCOUNTER — Ambulatory Visit (INDEPENDENT_AMBULATORY_CARE_PROVIDER_SITE_OTHER): Payer: BC Managed Care – PPO | Admitting: Family Medicine

## 2023-02-19 ENCOUNTER — Encounter: Payer: Self-pay | Admitting: Family Medicine

## 2023-02-19 VITALS — BP 112/70 | HR 65 | Temp 97.5°F | Ht 72.84 in | Wt 177.7 lb

## 2023-02-19 DIAGNOSIS — R5383 Other fatigue: Secondary | ICD-10-CM | POA: Diagnosis not present

## 2023-02-19 DIAGNOSIS — E538 Deficiency of other specified B group vitamins: Secondary | ICD-10-CM

## 2023-02-19 DIAGNOSIS — R790 Abnormal level of blood mineral: Secondary | ICD-10-CM

## 2023-02-19 DIAGNOSIS — Z0000 Encounter for general adult medical examination without abnormal findings: Secondary | ICD-10-CM | POA: Diagnosis not present

## 2023-02-19 DIAGNOSIS — Z1211 Encounter for screening for malignant neoplasm of colon: Secondary | ICD-10-CM

## 2023-02-19 LAB — BASIC METABOLIC PANEL
BUN: 17 mg/dL (ref 6–23)
CO2: 30 mEq/L (ref 19–32)
Calcium: 9.8 mg/dL (ref 8.4–10.5)
Chloride: 101 mEq/L (ref 96–112)
Creatinine, Ser: 1.02 mg/dL (ref 0.40–1.50)
GFR: 88.95 mL/min (ref >60.00)
Glucose, Bld: 98 mg/dL (ref 70–99)
Potassium: 4.2 mEq/L (ref 3.5–5.1)
Sodium: 137 mEq/L (ref 135–145)

## 2023-02-19 LAB — CBC WITH DIFFERENTIAL/PLATELET
Basophils Absolute: 0.1 10*3/uL (ref 0.0–0.1)
Basophils Relative: 0.8 % (ref 0.0–3.0)
Eosinophils Absolute: 0.1 10*3/uL (ref 0.0–0.7)
Eosinophils Relative: 2 % (ref 0.0–5.0)
HCT: 40.6 % (ref 39.0–52.0)
Hemoglobin: 13.4 g/dL (ref 13.0–17.0)
Lymphocytes Relative: 24.6 % (ref 12.0–46.0)
Lymphs Abs: 1.5 10*3/uL (ref 0.7–4.0)
MCHC: 33 g/dL (ref 30.0–36.0)
MCV: 92.5 fl (ref 78.0–100.0)
Monocytes Absolute: 0.6 10*3/uL (ref 0.1–1.0)
Monocytes Relative: 10 % (ref 3.0–12.0)
Neutro Abs: 3.9 10*3/uL (ref 1.4–7.7)
Neutrophils Relative %: 62.6 % (ref 43.0–77.0)
Platelets: 242 10*3/uL (ref 150.0–400.0)
RBC: 4.39 Mil/uL (ref 4.22–5.81)
RDW: 13.1 % (ref 11.5–15.5)
WBC: 6.2 10*3/uL (ref 4.0–10.5)

## 2023-02-19 LAB — TSH: TSH: 0.79 u[IU]/mL (ref 0.35–5.50)

## 2023-02-19 LAB — LIPID PANEL
Cholesterol: 180 mg/dL (ref 0–200)
HDL: 55.5 mg/dL (ref >39.00)
LDL Cholesterol: 109 mg/dL — ABNORMAL HIGH (ref 0–99)
NonHDL: 124.48
Total CHOL/HDL Ratio: 3
Triglycerides: 76 mg/dL (ref 0.0–149.0)
VLDL: 15.2 mg/dL (ref 0.0–40.0)

## 2023-02-19 LAB — HEPATIC FUNCTION PANEL
ALT: 10 U/L (ref 0–53)
AST: 17 U/L (ref 0–37)
Albumin: 4.6 g/dL (ref 3.5–5.2)
Alkaline Phosphatase: 44 U/L (ref 39–117)
Bilirubin, Direct: 0.2 mg/dL (ref 0.0–0.3)
Total Bilirubin: 1.4 mg/dL — ABNORMAL HIGH (ref 0.2–1.2)
Total Protein: 7.2 g/dL (ref 6.0–8.3)

## 2023-02-19 LAB — PSA: PSA: 0.6 ng/mL (ref 0.10–4.00)

## 2023-02-19 LAB — VITAMIN B12: Vitamin B-12: 210 pg/mL — ABNORMAL LOW (ref 211–911)

## 2023-02-19 LAB — FERRITIN: Ferritin: 51.2 ng/mL (ref 22.0–322.0)

## 2023-02-19 NOTE — Progress Notes (Signed)
Established Patient Office Visit  Subjective   Patient ID: John Guerra, male    DOB: 09/28/1975  Age: 47 y.o. MRN: [id number]  Chief Complaint  Patient presents with   Annual Exam    HPI   John Guerra is seen for physical exam.  Generally very healthy.  He exercises regularly, though less during the past year.  Currently about 4 hours/week.  Did a triathlon in April.  Has done Ironman triathlons in the past.  Stays very busy with 3 children who are swimmers.  Takes no medications.  Health maintenance reviewed  -Tetanus up-to-date -Just turned 47 with no history of colon cancer screening -No history of hepatitis C screening but low risk.  He declined screening today.  Family history reviewed.  Mother has type 2 diabetes but is overweight.  He states his father has history of congestive heart failure.  He is not sure regarding etiology of this.  Father also had a stroke in his early 9s.  He had a sister that died of drug overdose.   Social history-he is married.  He has 3 children.  [place] in [place] area.  Lived in [place] for 8 years.  Works for [employer].  No history of smoking.  Only occasional alcohol use  Past Medical History:  Diagnosis Date   Benign skin lesion 05/10/2020   Past Surgical History:  Procedure Laterality Date   Arthroscopic knee Left 1995   HERNIA REPAIR Left 1981   shave removal N/A 0924/21   rt chest , mid lower back , left inguinal area     reports that he has never smoked. He has never used smokeless tobacco. He reports current alcohol use. He reports that he does not currently use drugs. family history includes Alzheimer's disease in his maternal grandmother; Diabetes in his mother; Heart disease in his father; Heart disease (age of onset: 76) in his paternal grandfather; Stroke in his father. No Known Allergies  Review of Systems  Constitutional:  Negative for chills, fever, malaise/fatigue and weight loss.  HENT:  Negative for  hearing loss.   Eyes:  Negative for blurred vision and double vision.  Respiratory:  Negative for cough and shortness of breath.   Cardiovascular:  Negative for chest pain, palpitations and leg swelling.  Gastrointestinal:  Negative for abdominal pain, blood in stool, constipation and diarrhea.  Genitourinary:  Negative for dysuria.  Skin:  Negative for rash.  Neurological:  Negative for dizziness, speech change, seizures, loss of consciousness and headaches.  Psychiatric/Behavioral:  Negative for depression.       Objective:     BP 112/70 (BP Location: Left Arm, Patient Position: Sitting, Cuff Size: Normal)   Pulse 65   Temp (!) 97.5 F (36.4 C) (Oral)   Ht 6' 0.84" (1.85 m)   Wt 177 lb 11.2 oz (80.6 kg)   SpO2 98%   BMI 23.55 kg/m  BP Readings from Last 3 Encounters:  02/19/23 112/70  03/01/22 122/82  08/07/21 100/70   Wt Readings from Last 3 Encounters:  02/19/23 177 lb 11.2 oz (80.6 kg)  08/07/21 187 lb (84.8 kg)  07/14/21 185 lb 3.2 oz (84 kg)      Physical Exam Vitals reviewed.  Constitutional:      General: He is not in acute distress.    Appearance: He is well-developed.  HENT:     Head: Normocephalic and atraumatic.     Right Ear: External ear normal.     Left Ear:  External ear normal.  Eyes:     Conjunctiva/sclera: Conjunctivae normal.     Pupils: Pupils are equal, round, and reactive to light.  Neck:     Thyroid: No thyromegaly.  Cardiovascular:     Rate and Rhythm: Normal rate and regular rhythm.     Heart sounds: Normal heart sounds. No murmur heard. Pulmonary:     Effort: No respiratory distress.     Breath sounds: No wheezing or rales.  Abdominal:     General: Bowel sounds are normal. There is no distension.     Palpations: Abdomen is soft. There is no mass.     Tenderness: There is no abdominal tenderness. There is no guarding or rebound.  Musculoskeletal:     Cervical back: Normal range of motion and neck supple.     Right lower leg: No  edema.     Left lower leg: No edema.  Lymphadenopathy:     Cervical: No cervical adenopathy.  Skin:    Findings: No rash.  Neurological:     Mental Status: He is alert and oriented to person, place, and time.     Cranial Nerves: No cranial nerve deficit.     Deep Tendon Reflexes: Reflexes normal.      No results found for any visits on 02/19/23.    The ASCVD Risk score (Arnett DK, et al., 2019) failed to calculate for the following reasons:   Unable to determine if patient is Non-Hispanic African American    Assessment & Plan:   Physical exam.  Generally healthy 47 year old male.  He has had some nonspecific fatigue symptoms but usually only gets about 6 hours sleep at night.  He is a vegetarian with history of low B12.  Has also had slightly low ferritin in the past and has been more diligent with regard to iron supplementation.  -Set up initial screening colonoscopy-and he will be checking on insurance coverage -Discussed hepatitis C screening but he declines.  He is low risk -Continue regular exercise habits -Obtain screening labs as above including ferritin and B12 -We discussed pros and cons of PSA screening and  he would like to proceed.  No known family history of prostate cancer   No follow-ups on file.    Evelena Peat, MD

## 2023-03-01 ENCOUNTER — Ambulatory Visit: Payer: BC Managed Care – PPO | Admitting: Family Medicine

## 2023-03-01 DIAGNOSIS — D2271 Melanocytic nevi of right lower limb, including hip: Secondary | ICD-10-CM | POA: Diagnosis not present

## 2023-03-01 DIAGNOSIS — D2262 Melanocytic nevi of left upper limb, including shoulder: Secondary | ICD-10-CM | POA: Diagnosis not present

## 2023-03-01 DIAGNOSIS — D2272 Melanocytic nevi of left lower limb, including hip: Secondary | ICD-10-CM | POA: Diagnosis not present

## 2023-03-01 DIAGNOSIS — D2261 Melanocytic nevi of right upper limb, including shoulder: Secondary | ICD-10-CM | POA: Diagnosis not present

## 2023-04-06 DIAGNOSIS — M6281 Muscle weakness (generalized): Secondary | ICD-10-CM | POA: Diagnosis not present

## 2023-04-06 DIAGNOSIS — M25671 Stiffness of right ankle, not elsewhere classified: Secondary | ICD-10-CM | POA: Diagnosis not present

## 2023-04-06 DIAGNOSIS — M79661 Pain in right lower leg: Secondary | ICD-10-CM | POA: Diagnosis not present

## 2023-04-06 DIAGNOSIS — M79604 Pain in right leg: Secondary | ICD-10-CM | POA: Diagnosis not present

## 2023-04-09 DIAGNOSIS — M79661 Pain in right lower leg: Secondary | ICD-10-CM | POA: Diagnosis not present

## 2023-04-09 DIAGNOSIS — M6281 Muscle weakness (generalized): Secondary | ICD-10-CM | POA: Diagnosis not present

## 2023-04-09 DIAGNOSIS — M25671 Stiffness of right ankle, not elsewhere classified: Secondary | ICD-10-CM | POA: Diagnosis not present

## 2023-04-09 DIAGNOSIS — M79604 Pain in right leg: Secondary | ICD-10-CM | POA: Diagnosis not present

## 2023-04-14 DIAGNOSIS — M79604 Pain in right leg: Secondary | ICD-10-CM | POA: Diagnosis not present

## 2023-04-14 DIAGNOSIS — M25671 Stiffness of right ankle, not elsewhere classified: Secondary | ICD-10-CM | POA: Diagnosis not present

## 2023-04-14 DIAGNOSIS — M6281 Muscle weakness (generalized): Secondary | ICD-10-CM | POA: Diagnosis not present

## 2023-04-14 DIAGNOSIS — M79661 Pain in right lower leg: Secondary | ICD-10-CM | POA: Diagnosis not present

## 2023-04-16 DIAGNOSIS — M25671 Stiffness of right ankle, not elsewhere classified: Secondary | ICD-10-CM | POA: Diagnosis not present

## 2023-04-16 DIAGNOSIS — M79661 Pain in right lower leg: Secondary | ICD-10-CM | POA: Diagnosis not present

## 2023-04-16 DIAGNOSIS — M6281 Muscle weakness (generalized): Secondary | ICD-10-CM | POA: Diagnosis not present

## 2023-04-16 DIAGNOSIS — M79604 Pain in right leg: Secondary | ICD-10-CM | POA: Diagnosis not present

## 2023-04-21 DIAGNOSIS — M6281 Muscle weakness (generalized): Secondary | ICD-10-CM | POA: Diagnosis not present

## 2023-04-21 DIAGNOSIS — M79661 Pain in right lower leg: Secondary | ICD-10-CM | POA: Diagnosis not present

## 2023-04-21 DIAGNOSIS — M79604 Pain in right leg: Secondary | ICD-10-CM | POA: Diagnosis not present

## 2023-04-21 DIAGNOSIS — M25671 Stiffness of right ankle, not elsewhere classified: Secondary | ICD-10-CM | POA: Diagnosis not present

## 2023-05-31 ENCOUNTER — Encounter: Payer: Self-pay | Admitting: Internal Medicine

## 2023-06-14 ENCOUNTER — Encounter: Payer: Self-pay | Admitting: Internal Medicine

## 2023-06-14 ENCOUNTER — Ambulatory Visit (AMBULATORY_SURGERY_CENTER): Payer: BC Managed Care – PPO | Admitting: *Deleted

## 2023-06-14 VITALS — Ht 73.0 in | Wt 178.0 lb

## 2023-06-14 DIAGNOSIS — Z1211 Encounter for screening for malignant neoplasm of colon: Secondary | ICD-10-CM

## 2023-06-14 NOTE — Progress Notes (Signed)
Pt's name and DOB verified at the beginning of the pre-visit wit 2 identifiers  Pt denies any difficulty with ambulating,sitting, laying down or rolling side to side  Gave both [facility] main # and MD on call # prior to instructions.   No egg or soy allergy known to patient   No issues known to pt with past sedation with any surgeries or procedures  Pt denies having issues being intubated  Pt has no issues moving head neck or swallowing  No FH of Malignant Hyperthermia  Pt is not on diet pills or shots  Pt is not on home 02   Pt is not on blood thinners   Pt denies issues with constipation   Pt is not on dialysis  Pt denise any abnormal heart rhythms   Pt denies any upcoming cardiac testing  Pt encouraged to use to use Singlecare or Goodrx to reduce cost   Patient's chart reviewed by Cathlyn Parsons CNRA prior to pre-visit and patient appropriate for the [facility].  Pre-visit completed and red dot placed by patient's name on their procedure day (on provider's schedule).  .  Visit by phone  Pt states weight is 178 lb  Instructed pt why it is important to and  to call if they have any changes in health or new medications. Directed them to the # given and on instructions.     Instructions reviewed with pt and pt states understanding. Instructed to review again prior to procedure. Pt states they will.   Instructions sent by mail with coupon and by my chart

## 2023-07-11 NOTE — Progress Notes (Unsigned)
[facility] History and Physical   Primary Care Physician:  Kristian Covey, MD   Reason for Procedure:   CRCA screening  Plan:    colonoscopy     HPI: John Guerra is a 47 y.o. male for initial screening colonoscopy exam.   Past Medical History:  Diagnosis Date   Benign skin lesion 05/10/2020    Past Surgical History:  Procedure Laterality Date   Arthroscopic knee Left 1995   HERNIA REPAIR Left 1981   shave removal N/A 0924/21   rt chest , mid lower back , left inguinal area     Prior to Admission medications   Medication Sig Start Date End Date Taking? Authorizing Provider  Cyanocobalamin (VITAMIN B 12 PO) Take by mouth.    [provider]  ferrous sulfate 325 (65 FE) MG EC tablet Take 325 mg by mouth 3 (three) times daily with meals.    [provider]    Current Outpatient Medications  Medication Sig Dispense Refill   Cyanocobalamin (VITAMIN B 12 PO) Take by mouth.     ferrous sulfate 325 (65 FE) MG EC tablet Take 325 mg by mouth 3 (three) times daily with meals.     No current facility-administered medications for this visit.    Allergies as of 07/12/2023   (No Known Allergies)    Family History  Problem Relation Age of Onset   Diabetes Mother    Heart disease Father        Congestive heart failure   Stroke Father    Colon cancer Paternal Uncle    Alzheimer's disease Maternal Grandmother    Heart disease Paternal Grandfather 40       MI   Colon polyps Neg Hx    Esophageal cancer Neg Hx    Rectal cancer Neg Hx    Stomach cancer Neg Hx     Social History   Socioeconomic History   Marital status: Married    Spouse name: Not on file   Number of children: Not on file   Years of education: Not on file   Highest education level: Not on file  Occupational History   Not on file  Tobacco Use   Smoking status: Never   Smokeless tobacco: Never  Substance and Sexual Activity   Alcohol use: Yes   Drug use:  Never   Sexual activity: Yes    Partners: Female  Other Topics Concern   Not on file  Social History Narrative   Not on file   Social Determinants of Health   Financial Resource Strain: Not on file  Food Insecurity: Not on file  Transportation Needs: Not on file  Physical Activity: Not on file  Stress: Not on file  Social Connections: Not on file  Intimate Partner Violence: Not on file    Review of Systems: Positive for *** All other review of systems negative except as mentioned in the HPI.  Physical Exam: Vital signs There were no vitals taken for this visit.  General:   Alert,  Well-developed, well-nourished, pleasant and cooperative in NAD Lungs:  Clear throughout to auscultation.   Heart:  Regular rate and rhythm; no murmurs, clicks, rubs,  or gallops. Abdomen:  Soft, nontender and nondistended. Normal bowel sounds.   Neuro/Psych:  Alert and cooperative. Normal mood and affect. A and O x 3   @John Guerra  Sena Slate, MD, [facility] [phone] (pager) 07/11/2023 8:38 PM@

## 2023-07-12 ENCOUNTER — Ambulatory Visit (AMBULATORY_SURGERY_CENTER): Payer: BC Managed Care – PPO | Admitting: Internal Medicine

## 2023-07-12 ENCOUNTER — Encounter: Payer: Self-pay | Admitting: Internal Medicine

## 2023-07-12 VITALS — BP 102/64 | HR 59 | Temp 97.6°F | Resp 12 | Ht 73.0 in | Wt 178.0 lb

## 2023-07-12 DIAGNOSIS — Z1211 Encounter for screening for malignant neoplasm of colon: Secondary | ICD-10-CM | POA: Diagnosis not present

## 2023-07-12 MED ORDER — SODIUM CHLORIDE 0.9 % IV SOLN: 500.0000 mL | INTRAVENOUS | Status: DC

## 2023-07-12 NOTE — Progress Notes (Deleted)
Called to room to assist during endoscopic procedure.  Patient ID and intended procedure confirmed with present staff. Received instructions for my participation in the procedure from the performing physician.  

## 2023-07-12 NOTE — Patient Instructions (Addendum)
I am please to tell you that the colonoscopy was normal.  No polyps or cancer were seen.  Next routine colonoscopy or other screening test in 10 years - 2034.  I appreciate the opportunity to care for you. Iva Boop, MD  YOU HAD AN ENDOSCOPIC PROCEDURE TODAY AT THE [facility]:   Refer to the procedure report that was given to you for any specific questions about what was found during the examination.  If the procedure report does not answer your questions, please call your gastroenterologist to clarify.  If you requested that your care partner not be given the details of your procedure findings, then the procedure report has been included in a sealed envelope for you to review at your convenience later.  YOU SHOULD EXPECT: Some feelings of bloating in the abdomen. Passage of more gas than usual.  Walking can help get rid of the air that was put into your GI tract during the procedure and reduce the bloating. If you had a lower endoscopy (such as a colonoscopy or flexible sigmoidoscopy) you may notice spotting of blood in your stool or on the toilet paper. If you underwent a bowel prep for your procedure, you may not have a normal bowel movement for a few days.  Please Note:  You might notice some irritation and congestion in your nose or some drainage.  This is from the oxygen used during your procedure.  There is no need for concern and it should clear up in a day or so.  SYMPTOMS TO REPORT IMMEDIATELY:  Following lower endoscopy (colonoscopy or flexible sigmoidoscopy):  Excessive amounts of blood in the stool  Significant tenderness or worsening of abdominal pains  Swelling of the abdomen that is new, acute  Fever of 100F or higher  For urgent or emergent issues, a gastroenterologist can be reached at any hour by calling [phone]. Do not use MyChart messaging for urgent concerns.    DIET:  We do recommend a small meal at first, but then you may proceed to your  regular diet.  Drink plenty of fluids but you should avoid alcoholic beverages for 24 hours.  ACTIVITY:  You should plan to take it easy for the rest of today and you should NOT DRIVE or use heavy machinery until tomorrow (because of the sedation medicines used during the test).    FOLLOW UP: Our staff will call the number listed on your records the next business day following your procedure.  We will call around 7:15- 8:00 am to check on you and address any questions or concerns that you may have regarding the information given to you following your procedure. If we do not reach you, we will leave a message.     If any biopsies were taken you will be contacted by phone or by letter within the next 1-3 weeks.  Please call us at [phone] if you have not heard about the biopsies in 3 weeks.    SIGNATURES/CONFIDENTIALITY: You and/or your care partner have signed paperwork which will be entered into your electronic medical record.  These signatures attest to the fact that that the information above on your After Visit Summary has been reviewed and is understood.  Full responsibility of the confidentiality of this discharge information lies with you and/or your care-partner.

## 2023-07-12 NOTE — Progress Notes (Signed)
Pt's states no medical or surgical changes since previsit or office visit. 

## 2023-07-12 NOTE — Op Note (Signed)
[facility] Patient Name: John Guerra Procedure Date: 07/12/2023 10:24 AM MRN: [id number] Endoscopist: Iva Boop , MD, [id number] Age: 47 Referring MD:  Date of Birth: 08-Oct-1975 Gender: Male Account #: [id number] Procedure:                Colonoscopy Indications:              Screening for colorectal malignant neoplasm, This                            is the patient's first colonoscopy Medicines:                Monitored Anesthesia Care Procedure:                Pre-Anesthesia Assessment:                           - Prior to the procedure, a History and Physical                            was performed, and patient medications and                            allergies were reviewed. The patient's tolerance of                            previous anesthesia was also reviewed. The risks                            and benefits of the procedure and the sedation                            options and risks were discussed with the patient.                            All questions were answered, and informed consent                            was obtained. Prior Anticoagulants: The patient has                            taken no anticoagulant or antiplatelet agents. ASA                            Grade Assessment: I - A normal, healthy patient.                            After reviewing the risks and benefits, the patient                            was deemed in satisfactory condition to undergo the                            procedure.  After obtaining informed consent, the colonoscope                            was passed under direct vision. Throughout the                            procedure, the patient's blood pressure, pulse, and                            oxygen saturations were monitored continuously. The                            Olympus Scope [id number] was introduced through the                            anus and advanced to the the  cecum, identified by                            appendiceal orifice and ileocecal valve. The                            colonoscopy was performed without difficulty. The                            patient tolerated the procedure well. The quality                            of the bowel preparation was good. The ileocecal                            valve, appendiceal orifice, and rectum were                            photographed. The bowel preparation used was                            Miralax via split dose instruction. Scope In: 10:37:28 AM Scope Out: 10:53:48 AM Scope Withdrawal Time: 0 hours 10 minutes 27 seconds  Total Procedure Duration: 0 hours 16 minutes 20 seconds  Findings:                 The perianal and digital rectal examinations were                            normal.                           The entire examined colon appeared normal on direct                            and retroflexion views. Complications:            No immediate complications. Estimated Blood Loss:     Estimated blood loss: none. Impression:               - The entire examined  colon is normal on direct and                            retroflexion views. (slightly prominent hemorrhoids                            after colonoscopy prep)                           - No specimens collected. Recommendation:           - Patient has a contact number available for                            emergencies. The signs and symptoms of potential                            delayed complications were discussed with the                            patient. Return to normal activities tomorrow.                            Written discharge instructions were provided to the                            patient.                           - Resume previous diet.                           - Continue present medications.                           - Repeat colonoscopy in 10 years for screening                             purposes. Iva Boop, MD 07/12/2023 11:00:24 AM This report has been signed electronically.

## 2023-07-12 NOTE — Progress Notes (Signed)
Vss nad trans to pacu 

## 2023-07-13 ENCOUNTER — Telehealth: Payer: Self-pay | Admitting: *Deleted

## 2023-07-13 NOTE — Telephone Encounter (Signed)
  Follow up Call-     07/12/2023   10:06 AM  Call back number  Post procedure Call Back phone  # [phone]  Permission to leave phone message Yes     Patient questions:  Do you have a fever, pain , or abdominal swelling? No. Pain Score  0 *  Have you tolerated food without any problems? Yes.    Have you been able to return to your normal activities? Yes.    Do you have any questions about your discharge instructions: Diet   No. Medications  No. Follow up visit  No.  Do you have questions or concerns about your Care? No.  Actions: * If pain score is 4 or above: No action needed, pain <4.

## 2024-03-24 ENCOUNTER — Encounter: Payer: Self-pay | Admitting: Family Medicine

## 2024-03-24 ENCOUNTER — Other Ambulatory Visit

## 2024-03-24 ENCOUNTER — Ambulatory Visit: Admitting: Family Medicine

## 2024-03-24 VITALS — BP 110/80 | HR 67 | Temp 97.8°F | Ht 73.62 in | Wt 186.5 lb

## 2024-03-24 DIAGNOSIS — Z0000 Encounter for general adult medical examination without abnormal findings: Secondary | ICD-10-CM

## 2024-03-24 DIAGNOSIS — Z8639 Personal history of other endocrine, nutritional and metabolic disease: Secondary | ICD-10-CM | POA: Diagnosis not present

## 2024-03-24 DIAGNOSIS — E785 Hyperlipidemia, unspecified: Secondary | ICD-10-CM | POA: Diagnosis not present

## 2024-03-24 DIAGNOSIS — Z789 Other specified health status: Secondary | ICD-10-CM | POA: Diagnosis not present

## 2024-03-24 LAB — CBC WITH DIFFERENTIAL/PLATELET
Basophils Absolute: 0.1 K/uL (ref 0.0–0.1)
Basophils Relative: 1.3 % (ref 0.0–3.0)
Eosinophils Absolute: 0.2 K/uL (ref 0.0–0.7)
Eosinophils Relative: 3.3 % (ref 0.0–5.0)
HCT: 38.9 % — ABNORMAL LOW (ref 39.0–52.0)
Hemoglobin: 13.1 g/dL (ref 13.0–17.0)
Lymphocytes Relative: 30.1 % (ref 12.0–46.0)
Lymphs Abs: 1.5 K/uL (ref 0.7–4.0)
MCHC: 33.6 g/dL (ref 30.0–36.0)
MCV: 92.1 fl (ref 78.0–100.0)
Monocytes Absolute: 0.6 K/uL (ref 0.1–1.0)
Monocytes Relative: 11.1 % (ref 3.0–12.0)
Neutro Abs: 2.8 K/uL (ref 1.4–7.7)
Neutrophils Relative %: 54.2 % (ref 43.0–77.0)
Platelets: 230 K/uL (ref 150.0–400.0)
RBC: 4.22 Mil/uL (ref 4.22–5.81)
RDW: 13.1 % (ref 11.5–15.5)
WBC: 5.1 K/uL (ref 4.0–10.5)

## 2024-03-24 LAB — HEPATIC FUNCTION PANEL
ALT: 11 U/L (ref 0–53)
AST: 16 U/L (ref 0–37)
Albumin: 4.9 g/dL (ref 3.5–5.2)
Alkaline Phosphatase: 45 U/L (ref 39–117)
Bilirubin, Direct: 0.2 mg/dL (ref 0.0–0.3)
Total Bilirubin: 1.1 mg/dL (ref 0.2–1.2)
Total Protein: 7.3 g/dL (ref 6.0–8.3)

## 2024-03-24 LAB — BASIC METABOLIC PANEL WITH GFR
BUN: 14 mg/dL (ref 6–23)
CO2: 29 meq/L (ref 19–32)
Calcium: 9.5 mg/dL (ref 8.4–10.5)
Chloride: 101 meq/L (ref 96–112)
Creatinine, Ser: 0.97 mg/dL (ref 0.40–1.50)
GFR: 93.75 mL/min (ref >60.00)
Glucose, Bld: 84 mg/dL (ref 70–99)
Potassium: 4.6 meq/L (ref 3.5–5.1)
Sodium: 140 meq/L (ref 135–145)

## 2024-03-24 LAB — LIPID PANEL
Cholesterol: 196 mg/dL (ref 0–200)
HDL: 65.7 mg/dL (ref >39.00)
LDL Cholesterol: 119 mg/dL — ABNORMAL HIGH (ref 0–99)
NonHDL: 130.38
Total CHOL/HDL Ratio: 3
Triglycerides: 56 mg/dL (ref 0.0–149.0)
VLDL: 11.2 mg/dL (ref 0.0–40.0)

## 2024-03-24 LAB — FERRITIN: Ferritin: 43.3 ng/mL (ref 22.0–322.0)

## 2024-03-24 LAB — PSA: PSA: 0.55 ng/mL (ref 0.10–4.00)

## 2024-03-24 LAB — VITAMIN B12: Vitamin B-12: 231 pg/mL (ref 211–911)

## 2024-03-24 NOTE — Progress Notes (Signed)
 Established Patient Office Visit  Subjective   Patient ID: John Guerra, male    DOB: 1976/01/22  Age: 48 y.o. MRN: [id number]  Chief Complaint  Patient presents with   Annual Exam    HPI   Garrel is seen today for physical exam.  Very health-conscious.  Exercises several hours per week.  Has participated in triathlons previously.  No longer competing.  Stays very busy with his kids who are involved with swimming.  Garrel follows vegetarian diet along with his wife.  Does have history of low B12.  Takes occasional supplement.  No prescription medications.  Weight is up a bit from last year and he attributes this to increase in muscle mass.  He has done more resistance training  Health maintenance reviewed:  -Tetanus due 2031 -Colonoscopy due 2034 -Plans to get flu vaccine later this fall  Social history-married with 3 children.  Originally from [place] .  Lived for 8 years in [place].  Works for [employer].  Never smoked.  Infrequent alcohol use  Family history reviewed.  Mother has type 2 diabetes but is overweight.  He states his father has history of congestive heart failure.  He is not sure regarding etiology of this.  Father also had a stroke in his early 42s.  He had a sister that died of drug overdose.   Past Medical History:  Diagnosis Date   Benign skin lesion 05/10/2020   Past Surgical History:  Procedure Laterality Date   Arthroscopic knee Left 1995   HERNIA REPAIR Left 1981   shave removal N/A 0924/21   rt chest , mid lower back , left inguinal area     reports that he has never smoked. He has never used smokeless tobacco. He reports current alcohol use. He reports that he does not use drugs. family history includes Alzheimer's disease in his maternal grandmother; Colon cancer in his paternal uncle; Diabetes in his mother; Heart disease in his father; Heart disease (age of onset: 93) in his paternal grandfather; Stroke in his father. No Known  Allergies   Review of Systems  Constitutional:  Negative for chills, fever, malaise/fatigue and weight loss.  HENT:  Negative for hearing loss.   Eyes:  Negative for blurred vision and double vision.  Respiratory:  Negative for cough and shortness of breath.   Cardiovascular:  Negative for chest pain, palpitations and leg swelling.  Gastrointestinal:  Negative for abdominal pain, blood in stool, constipation and diarrhea.  Genitourinary:  Negative for dysuria.  Skin:  Negative for rash.  Neurological:  Negative for dizziness, speech change, seizures, loss of consciousness and headaches.  Psychiatric/Behavioral:  Negative for depression.       Objective:     BP 110/80   Pulse 67   Temp 97.8 F (36.6 C) (Oral)   Ht 6' 1.62 (1.87 m)   Wt 186 lb 8 oz (84.6 kg)   SpO2 98%   BMI 24.19 kg/m  BP Readings from Last 3 Encounters:  03/24/24 110/80  07/12/23 102/64  02/19/23 112/70   Wt Readings from Last 3 Encounters:  03/24/24 186 lb 8 oz (84.6 kg)  07/12/23 178 lb (80.7 kg)  06/14/23 178 lb (80.7 kg)      Physical Exam Vitals reviewed.  Constitutional:      General: He is not in acute distress.    Appearance: He is well-developed. He is not ill-appearing.  HENT:     Head: Normocephalic and atraumatic.     Right  Ear: External ear normal.     Left Ear: External ear normal.  Eyes:     Conjunctiva/sclera: Conjunctivae normal.     Pupils: Pupils are equal, round, and reactive to light.  Neck:     Thyroid: No thyromegaly.  Cardiovascular:     Rate and Rhythm: Normal rate and regular rhythm.     Heart sounds: Normal heart sounds. No murmur heard. Pulmonary:     Effort: No respiratory distress.     Breath sounds: No wheezing or rales.  Abdominal:     General: Bowel sounds are normal. There is no distension.     Palpations: Abdomen is soft. There is no mass.     Tenderness: There is no abdominal tenderness. There is no guarding or rebound.  Musculoskeletal:      Cervical back: Normal range of motion and neck supple.  Lymphadenopathy:     Cervical: No cervical adenopathy.  Skin:    Findings: No rash.  Neurological:     Mental Status: He is alert and oriented to person, place, and time.     Cranial Nerves: No cranial nerve deficit.      No results found for any visits on 03/24/24.    The ASCVD Risk score (Arnett DK, et al., 2019) failed to calculate for the following reasons:   Unable to determine if patient is Non-Hispanic African American    Assessment & Plan:   Problem List Items Addressed This Visit   None Visit Diagnoses       Physical exam    -  Primary   Relevant Orders   Basic metabolic panel with GFR   Lipid panel   Hepatic function panel   CBC with Differential/Platelet   PSA   Lipoprotein A (LPA)     Vegetarian       Relevant Orders   Vitamin B12     History of iron deficiency       Relevant Orders   Ferritin     Here for physical exam.  Generally very healthy.  He and his wife are very health-conscious with regard to diet and exercise.  Does follow a vegetarian diet and does have history of low B12.  Rechecking labs including B12.  Father with history of CVA at a relatively early age.  We did discuss other screenings for clarifying risk status including LP(a) and he would like to proceed with that.  Colonoscopy was done just last year and normal.  Recommend annual flu vaccine.  No follow-ups on file.    Wolm Scarlet, MD

## 2024-03-26 ENCOUNTER — Ambulatory Visit: Payer: Self-pay | Admitting: Family Medicine

## 2024-03-27 LAB — LIPOPROTEIN A (LPA): Lipoprotein (a): 21 nmol/L (ref <75)

## 2024-04-06 DIAGNOSIS — D2271 Melanocytic nevi of right lower limb, including hip: Secondary | ICD-10-CM | POA: Diagnosis not present

## 2024-04-06 DIAGNOSIS — D2261 Melanocytic nevi of right upper limb, including shoulder: Secondary | ICD-10-CM | POA: Diagnosis not present

## 2024-04-06 DIAGNOSIS — D2262 Melanocytic nevi of left upper limb, including shoulder: Secondary | ICD-10-CM | POA: Diagnosis not present

## 2024-04-06 DIAGNOSIS — D485 Neoplasm of uncertain behavior of skin: Secondary | ICD-10-CM | POA: Diagnosis not present

## 2024-04-06 DIAGNOSIS — D2272 Melanocytic nevi of left lower limb, including hip: Secondary | ICD-10-CM | POA: Diagnosis not present

## 2024-04-06 DIAGNOSIS — I788 Other diseases of capillaries: Secondary | ICD-10-CM | POA: Diagnosis not present

## 2024-07-06 DIAGNOSIS — D2271 Melanocytic nevi of right lower limb, including hip: Secondary | ICD-10-CM | POA: Diagnosis not present

## 2024-07-06 DIAGNOSIS — D485 Neoplasm of uncertain behavior of skin: Secondary | ICD-10-CM | POA: Diagnosis not present

## 2024-08-08 ENCOUNTER — Telehealth: Payer: Self-pay

## 2024-08-08 MED ORDER — OSELTAMIVIR PHOSPHATE 75 MG PO CAPS: 75.0000 mg | ORAL_CAPSULE | Freq: Every day | ORAL | 0 refills | Status: AC

## 2024-08-08 NOTE — Telephone Encounter (Signed)
 Copied from CRM #[id number]. Topic: General - Other >> Aug 08, 2024  2:53 PM Rosina BIRCH wrote: Reason for CRM: patient called stating both of his children have been diagnosed with the flu today and he want to know if his wife and him should also take a preventative dose of tamiflu . The wife is also patient of MD Burchette [phone]

## 2024-08-08 NOTE — Addendum Note (Signed)
 Addended by: MICHEAL WOLM ORN on: 08/08/2024 05:16 PM   Modules accepted: Orders

## 2024-08-08 NOTE — Telephone Encounter (Signed)
 Spoke with John Guerra.  2 of their children have influenza A.  We sent in Tamiflu  75 mg once daily for both John Guerra and his wife to start on for at least the next 10 days.  We also discussed converting this to twice daily Tamiflu  if they develop acute symptoms of influenza over the next couple days  Wolm LELON Scarlet MD [facility]
